# Patient Record
Sex: Female | Born: 1965
Health system: Southern US, Community
[De-identification: ages and names within clinical notes are randomized; demographics above are authoritative.]

## PROBLEM LIST (undated history)

## (undated) DIAGNOSIS — A159 Respiratory tuberculosis unspecified: Secondary | ICD-10-CM

## (undated) DIAGNOSIS — D649 Anemia, unspecified: Secondary | ICD-10-CM

## (undated) HISTORY — PX: BREAST EXCISIONAL BIOPSY: SUR124

## (undated) HISTORY — PX: DILATION AND CURETTAGE OF UTERUS: SHX78

## (undated) HISTORY — PX: BREAST SURGERY: SHX581

## (undated) HISTORY — PX: OTHER SURGICAL HISTORY: SHX169

## (undated) HISTORY — PX: WISDOM TOOTH EXTRACTION: SHX21

---

## 1997-09-22 DIAGNOSIS — A159 Respiratory tuberculosis unspecified: Secondary | ICD-10-CM

## 1997-09-22 HISTORY — DX: Respiratory tuberculosis unspecified: A15.9

## 1998-03-19 ENCOUNTER — Other Ambulatory Visit: Admission: RE | Admit: 1998-03-19 | Discharge: 1998-03-19 | Payer: Self-pay | Admitting: Obstetrics and Gynecology

## 1998-04-09 ENCOUNTER — Ambulatory Visit (HOSPITAL_COMMUNITY): Admission: RE | Admit: 1998-04-09 | Discharge: 1998-04-09 | Payer: Self-pay | Admitting: Obstetrics and Gynecology

## 1998-05-03 ENCOUNTER — Other Ambulatory Visit: Admission: RE | Admit: 1998-05-03 | Discharge: 1998-05-03 | Payer: Self-pay | Admitting: General Surgery

## 2000-10-05 ENCOUNTER — Other Ambulatory Visit: Admission: RE | Admit: 2000-10-05 | Discharge: 2000-10-05 | Payer: Self-pay | Admitting: Obstetrics and Gynecology

## 2001-08-24 ENCOUNTER — Encounter: Admission: RE | Admit: 2001-08-24 | Discharge: 2001-09-23 | Payer: Self-pay | Admitting: Obstetrics and Gynecology

## 2006-02-24 ENCOUNTER — Ambulatory Visit (HOSPITAL_COMMUNITY): Admission: AD | Admit: 2006-02-24 | Discharge: 2006-02-24 | Payer: Self-pay | Admitting: Obstetrics and Gynecology

## 2006-02-24 ENCOUNTER — Encounter (INDEPENDENT_AMBULATORY_CARE_PROVIDER_SITE_OTHER): Payer: Self-pay | Admitting: *Deleted

## 2006-02-25 ENCOUNTER — Inpatient Hospital Stay (HOSPITAL_COMMUNITY): Admission: AD | Admit: 2006-02-25 | Discharge: 2006-02-25 | Payer: Self-pay | Admitting: Obstetrics and Gynecology

## 2006-07-27 ENCOUNTER — Other Ambulatory Visit: Admission: RE | Admit: 2006-07-27 | Discharge: 2006-07-27 | Payer: Self-pay | Admitting: Obstetrics and Gynecology

## 2006-09-30 ENCOUNTER — Ambulatory Visit (HOSPITAL_COMMUNITY): Admission: RE | Admit: 2006-09-30 | Discharge: 2006-09-30 | Payer: Self-pay | Admitting: Obstetrics and Gynecology

## 2007-08-05 ENCOUNTER — Other Ambulatory Visit: Admission: RE | Admit: 2007-08-05 | Discharge: 2007-08-05 | Payer: Self-pay | Admitting: Obstetrics & Gynecology

## 2007-10-19 ENCOUNTER — Ambulatory Visit (HOSPITAL_COMMUNITY): Admission: RE | Admit: 2007-10-19 | Discharge: 2007-10-19 | Payer: Self-pay | Admitting: Obstetrics and Gynecology

## 2008-08-15 ENCOUNTER — Other Ambulatory Visit: Admission: RE | Admit: 2008-08-15 | Discharge: 2008-08-15 | Payer: Self-pay | Admitting: Obstetrics and Gynecology

## 2008-11-30 ENCOUNTER — Ambulatory Visit (HOSPITAL_COMMUNITY): Admission: RE | Admit: 2008-11-30 | Discharge: 2008-11-30 | Payer: Self-pay | Admitting: Obstetrics and Gynecology

## 2009-12-04 ENCOUNTER — Ambulatory Visit (HOSPITAL_COMMUNITY): Admission: RE | Admit: 2009-12-04 | Discharge: 2009-12-04 | Payer: Self-pay | Admitting: Obstetrics and Gynecology

## 2010-12-05 ENCOUNTER — Other Ambulatory Visit: Payer: Self-pay | Admitting: Obstetrics and Gynecology

## 2010-12-05 DIAGNOSIS — Z1231 Encounter for screening mammogram for malignant neoplasm of breast: Secondary | ICD-10-CM

## 2010-12-17 ENCOUNTER — Ambulatory Visit (HOSPITAL_COMMUNITY)
Admission: RE | Admit: 2010-12-17 | Discharge: 2010-12-17 | Disposition: A | Discharge status: Home / Self Care | Payer: BC Managed Care – PPO | Source: Ambulatory Visit | Attending: Obstetrics and Gynecology | Admitting: Obstetrics and Gynecology

## 2010-12-17 DIAGNOSIS — Z1231 Encounter for screening mammogram for malignant neoplasm of breast: Secondary | ICD-10-CM | POA: Insufficient documentation

## 2011-02-07 NOTE — Consult Note (Signed)
NAMESHAHARA, HARTSFIELD NO.:  [id number]   MEDICAL RECORD NO.:  [id number]          PATIENT TYPE:  MAT   LOCATION:  MATC                          FACILITY:  [facility]   PHYSICIAN:  Janine Limbo, M.D.DATE OF BIRTH:  11-Nov-1965   DATE OF CONSULTATION:  02/25/2006  DATE OF DISCHARGE:                                   CONSULTATION   REFERRING PHYSICIAN:  Naima A. Normand Sloop, M.D.   HISTORY OF PRESENT ILLNESS:  Ms. Scruton is a 45 year old female, para 3-0-  1-3, who presents with complaints of a fever at home of 102.  The patient  has been followed at the [facility]  of [facility].  The patient had a dilatation and  curettage on February 24, 2006 because of a first trimester miscarriage.  She  experienced very heavy vaginal bleeding prior to her procedure.  Her  procedure went well and there were no complications.  The patient was  discharged to home on Methergine, iron, and vitamins.  The patient reported  today, however, that she was having a fever even after taking Tylenol.   DRUG ALLERGIES:  No known drug allergies.   OBSTETRICAL HISTORY:  The patient has had 3 term vaginal deliveries.   SOCIAL HISTORY:  The patient denies cigarette use, alcohol use, and  recreational drug use.   REVIEW OF SYSTEMS:  Noncontributory.   FAMILY HISTORY:  Noncontributory.   PHYSICAL EXAM:  VITAL SIGNS:  Blood pressure 87/50 lying, 90/55 sitting,  89/58 standing.  Pulse 67 lying, 84 sitting, 112 standing.  Temperature is  98.1.  O2 SAT is 99% on room air.  HEENT:  Within normal limits.  CHEST:  Clear.  HEART:  Regular rate and rhythm.  ABDOMEN:  Nontender.  PELVIC:  External genitalia is normal.  Vagina is normal and nontender.  Cervix is nontender to motion.  Uterus is upper of limits normal size,  moderately firm, and nontender.  Adnexa -- no masses.   LABORATORY VALUES:  Blood type is O positive.  Chemistries are  within normal  limits, except for glucose of 103, total protein of 5.4, albumin of 2.9, and  an alkaline phosphatase of 30.  White blood cell count is 5200.  Hemoglobin  is 6.8.  Hematocrit is 20.6%.  Platelet count is 190,000.  Hemoglobin on  February 24, 2006 was 10.6.   ASSESSMENT:  1.  Fever at home, but afebrile here in the hospital.  2.  Significant anemia.  3.  Status post dilatation and curettage for a first trimester miscarriage.  4.  Orthostatic pulse changes but no orthostatic blood pressure changes.   PLAN:  A long discussion was held with the patient and her husband.  Options  were reviewed for transfusion.  The risk and benefit of transfusion were  outlined.  We also offered the patient to take iron at home and try to  increase her hemoglobin on her own.  She chooses this option.  She will take  iron 325 mg three times each day.  She will also increase her vitamin C  intake.  The patient will call for any orthostatic symptoms at home.  She  will call for a temperature greater than 100.4.  She will call for questions  or concerns.  She will otherwise plan to return to the office in 2 weeks for  followup examination.  She will continue to take ibuprofen for pain and  Methergine to help her uterus contract.      Janine Limbo, M.D.  Electronically Signed     AVS/MEDQ  D:  02/25/2006  T:  02/26/2006  Job:  [id number]   cc:   Naima A. Normand Sloop, M.D.  Fax: [phone]

## 2011-02-07 NOTE — Op Note (Signed)
[name], Cynthia Davis          ACCOUNT NO.:  [id number]   MEDICAL RECORD NO.:  [id number]          PATIENT TYPE:  AMB   LOCATION:  SDC                           [facility]ILITY:  [facility]   PHYSICIAN:  Naima A. Dillard, M.D. DATE OF BIRTH:  1966-04-06   DATE OF PROCEDURE:  02/24/2006  DATE OF DISCHARGE:                                 OPERATIVE REPORT   PREOPERATIVE DIAGNOSES:  1.  Incomplete abortion at eight weeks with,  2.  Orthostatic hypotension.   POSTOPERATIVE DIAGNOSES:  1.  Incomplete abortion at eight weeks with,  2.  Orthostatic hypotension.   OPERATION PERFORMED:  Dilation and evacuation.   SURGEON:  Naima A. Dillard, M.D.   ASSISTANT:  None.   ANESTHESIA:  MAC and local anesthesia.   SPECIMENS:  The specimens consists of products of conception, which were  sent to pathology.   ESTIMATED BLOOD LOSS:  The estimated blood loss was minimal.   COMPLICATIONS:  The complications are none.   CONDITION:  The patient went to the PACU in stable condition.   CONSENT:  Before the surgery the patient understood the risks to be, but not  limited to bleeding, infection perforation of the uterus, Asherman syndrome  and the patient still consented to the procedure.  She was orthostatic and  was told that we would recheck orthostatic values after the procedure; and,  if low, would offer her a blood transfusion.   DESCRIPTION OF THE OPERATION:  The patient was brought to the operating room  and placed in the dorsal lithotomy position.  She was prepped and draped in  the normal sterile fashion.  A catheter was used to drain the bladder.  Before the surgery was done the patient was examined and was noted to be 2  cm dilated and about eight weeks size.  She had an anteverted uterus with no  adnexal masses.   A bivalve speculum was placed into the vagina.  The anterior lip of the  cervix was grasped with a single-toothed tenaculum.  A total of 80 mL of 1%  lidocaine and 2 mL of bicarb  were used for a cervical block.  The cervix was  already dilated.  There were multiple products of conception seen at the os,  which were teased out with ring forceps.  Then a sharp curettage was done of  the cavity and more products of conception were removed from the uterus.  A  size 8 suction curettage was performed of the uterus and texture was noted  around all areas.  The tenaculum was removed and there was some bleeding  from the tenaculum site, which was relieved with pressure and silver  nitrate.   All instruments were removed from the vagina.   COUNTS:  Sponge, lap and needle counts were correct times two.   DISPOSITION:  The patient went to the recovery room/PACU  in stable  condition as noted above.      Naima A. Normand Sloop, M.D.  Electronically Signed     NAD/MEDQ  D:  02/24/2006  T:  02/25/2006  Job:  [id number]

## 2011-02-07 NOTE — H&P (Signed)
Cynthia Davis, Cynthia Davis          ACCOUNT NO.:  [id number]   MEDICAL RECORD NO.:  [id number]           PATIENT TYPE:   LOCATION:                                 FACILITY:   PHYSICIAN:  Naima A. Dillard, M.D. DATE OF BIRTH:  Nov 21, 1965   DATE OF ADMISSION:  DATE OF DISCHARGE:                                HISTORY & PHYSICAL   Cynthia Davis is a 45 year old gravida 4, para 3, 0-0-3, who presents today  at 10 weeks and 5 days by last menstrual period but 8 weeks and 2 days by  ultrasound, showing an intrauterine fetal demise.  The patient's history has  been remarkable for  advanced maternal age, transfer from [facility],  first trimester bleeding.  The patient was seen at [facility]  on Jan 21, 2006 for a consult about the practice.  She was interested in  transferring from [facility] in [place].  She was then seen on Jan 26, 2006 for an ultrasound due to dating.  This showed an intrauterine  pregnancy at 6 weeks, 5 days with an [facility] of September 16, 2006.  Fetal heart  rate was in the 140s.  The patient was then scheduled for a new OB workup in  one week; however, she presented today with onset of a small amount of  bright red bleeding.  She was having no pain at the time.  The patient was  brought to the office, where an ultrasound was done, and an 8 week fetal  demise was noted.  Ovaries were normal.  There was a small amount of pink  mucus noted at the os.  Cervix was closed.   Options were reviewed with the patient for observation, Cytotec induction of  miscarriage or dilatation and evacuation.  The patient was allowed to  consider the risks and benefits of each, and she elected to proceed with  dilatation and evacuation.  This has therefore been scheduled today with Dr.  Normand Sloop.   PRENATAL LABS:  Blood type is O+, per [facility] record.   OBSTETRICAL HISTORY:  The patient has a history of three previous full-term  deliveries, all of which occurred  at [facility].  She had  some borderline anemia with her pregnancies.   PAST MEDICAL HISTORY:  Remarkable for a breast lump that was diagnosed in  1999.  She was referred to Dr. Lurene Shadow for evaluation of a breast lump.  This  was evaluated at [facility].  This is noted to have a palpable mass  in the right breast.  The patient deferred any other management.  Dr.  Stefano Gaul was consulted, and his recommendation was that those areas of  breast lesions be removed; however, the patient in her previous evaluation  was told they did not need to be removed, and she elected to proceed with  this situation.   FAMILY HISTORY:  Noncontributory.   SOCIAL HISTORY:  Patient is married.  She is a Futures trader.  She does not  smoke, drink, or use drugs.   PHYSICAL EXAMINATION:  VITAL SIGNS:  Stable.  Patient  is afebrile.  HEENT:  Within normal limits.  LUNGS:  Bilateral breath sounds are clear.  HEART:  Regular rate and rhythm without murmur.  BREASTS:  Soft and nontender.  ABDOMEN:  Soft and nontender.  PELVIC:  Cervix is noted to be closed.  There is a small amount of pink  mucus at the cervical os.  GC and Chlamydia cultures were done.  Uterus is  nontender.  It is slightly enlarged at approximately eight weeks size.  EXTREMITIES:  Deep tendon reflexes are 2+ without clonus.  There is trace  edema noted.   IMPRESSION:  1.  Intrauterine fetal demise at 8 weeks and 2 days by size.  2.  O+ blood type.   PLAN:  1.  Patient is to be admitted for outpatient D&E on February 24, 2006 for a      consult with Dr. Normand Sloop as attending physician.  2.  Routine physician preoperative orders.  3.  Support is offered to the patient for her loss.      Renaldo Reel Emilee Hero, C.N.M.      Naima A. Normand Sloop, M.D.  Electronically Signed    VLL/MEDQ  D:  02/23/2006  T:  02/23/2006  Job:  [id number]

## 2011-12-11 ENCOUNTER — Encounter (HOSPITAL_COMMUNITY): Payer: Self-pay | Admitting: *Deleted

## 2011-12-16 ENCOUNTER — Other Ambulatory Visit: Payer: Self-pay | Admitting: Obstetrics and Gynecology

## 2011-12-16 DIAGNOSIS — Z1231 Encounter for screening mammogram for malignant neoplasm of breast: Secondary | ICD-10-CM

## 2012-01-05 ENCOUNTER — Encounter (HOSPITAL_COMMUNITY): Payer: Self-pay

## 2012-01-14 ENCOUNTER — Ambulatory Visit (HOSPITAL_COMMUNITY)
Admission: RE | Admit: 2012-01-14 | Discharge: 2012-01-14 | Disposition: A | Discharge status: Home / Self Care | Payer: BC Managed Care – PPO | Source: Ambulatory Visit | Attending: Obstetrics and Gynecology | Admitting: Obstetrics and Gynecology

## 2012-01-14 DIAGNOSIS — Z1231 Encounter for screening mammogram for malignant neoplasm of breast: Secondary | ICD-10-CM | POA: Insufficient documentation

## 2012-01-16 NOTE — H&P (Signed)
Cynthia Davis is an 46 y.o. female.   Chief Complaint: abnormal bleeding HPI: G6P3 with spotting between menses.  PUS showed uterus 9 x 5 x 6 cm with a 2 cm endometrial polyp.  Adnexa normal. Condoms for BC.    Past Medical History  Diagnosis Date  . Tuberculosis     Test pos for TB,  neg chest x-ray per patient  . Anemia     history    Past Surgical History  Procedure Date  . Dilation and curettage of uterus   . Wisdom tooth extraction   . Tongue clipped     at age 46 yrs old    History reviewed. No pertinent family history. Social History:  reports that she has never smoked. She has never used smokeless tobacco. She reports that she drinks alcohol. She reports that she does not use illicit drugs.  Allergies: No Known Allergies  No prescriptions prior to admission    No results found for this or any previous visit (from the past 48 hour(s)). No results found.  Review of Systems  All other systems reviewed and are negative.    Height 5' 6.5" (1.689 m), weight 68.04 kg (150 lb), last menstrual period 11/19/2011. Physical Exam  Constitutional: She is oriented to person, place, and time. She appears well-developed and well-nourished.  HENT:  Head: Normocephalic and atraumatic.  Eyes: Conjunctivae are normal.  Neck: Normal range of motion. Neck supple.  Cardiovascular: Normal rate and regular rhythm.   Respiratory: Effort normal and breath sounds normal.  GI: Soft. Bowel sounds are normal.  Genitourinary: Vagina normal.       Uterus upper limits nl size, NT Adnexa nl.  Musculoskeletal: Normal range of motion.  Neurological: She is alert and oriented to person, place, and time.  Skin: Skin is warm and dry.  Psychiatric: She has a normal mood and affect.     Assessment/Plan Endometrial polyp;  Plan hysteroscopic resection with D & C.  Julez Huseby P 01/16/2012, 3:04 PM

## 2012-01-19 ENCOUNTER — Encounter (HOSPITAL_COMMUNITY): Payer: Self-pay | Admitting: *Deleted

## 2012-01-19 ENCOUNTER — Ambulatory Visit (HOSPITAL_COMMUNITY)
Admission: RE | Admit: 2012-01-19 | Discharge: 2012-01-19 | Disposition: A | Discharge status: Home / Self Care | Payer: BC Managed Care – PPO | Source: Ambulatory Visit | Attending: Obstetrics and Gynecology | Admitting: Obstetrics and Gynecology

## 2012-01-19 ENCOUNTER — Encounter (HOSPITAL_COMMUNITY): Payer: Self-pay | Admitting: Anesthesiology

## 2012-01-19 ENCOUNTER — Encounter (HOSPITAL_COMMUNITY)
Admission: RE | Disposition: A | Discharge status: Home / Self Care | Payer: Self-pay | Source: Ambulatory Visit | Attending: Obstetrics and Gynecology

## 2012-01-19 ENCOUNTER — Ambulatory Visit (HOSPITAL_COMMUNITY): Payer: BC Managed Care – PPO | Admitting: Anesthesiology

## 2012-01-19 DIAGNOSIS — N840 Polyp of corpus uteri: Secondary | ICD-10-CM | POA: Insufficient documentation

## 2012-01-19 DIAGNOSIS — N938 Other specified abnormal uterine and vaginal bleeding: Secondary | ICD-10-CM | POA: Insufficient documentation

## 2012-01-19 DIAGNOSIS — N949 Unspecified condition associated with female genital organs and menstrual cycle: Secondary | ICD-10-CM | POA: Insufficient documentation

## 2012-01-19 HISTORY — PX: OTHER SURGICAL HISTORY: SHX169

## 2012-01-19 HISTORY — DX: Anemia, unspecified: D64.9

## 2012-01-19 HISTORY — DX: Respiratory tuberculosis unspecified: A15.9

## 2012-01-19 LAB — CBC: Platelets: 213 10*3/uL (ref 150–400)

## 2012-01-19 SURGERY — DILATATION & CURETTAGE/HYSTEROSCOPY WITH RESECTOCOPE
Anesthesia: General | Site: Vagina | Wound class: Clean Contaminated

## 2012-01-19 MED ORDER — ONDANSETRON HCL 4 MG/2ML IJ SOLN
INTRAMUSCULAR | Status: DC | PRN
  Administered 2012-01-19: 4 mg via INTRAVENOUS

## 2012-01-19 MED ORDER — SODIUM CHLORIDE 0.9 % IJ SOLN: 3.0000 mL | INTRAMUSCULAR | Status: DC | PRN

## 2012-01-19 MED ORDER — LIDOCAINE HCL (CARDIAC) 20 MG/ML IV SOLN
INTRAVENOUS | Status: DC | PRN
  Administered 2012-01-19: 100 mg via INTRAVENOUS

## 2012-01-19 MED ORDER — ACETAMINOPHEN 325 MG PO TABS: 650.0000 mg | ORAL_TABLET | ORAL | Status: DC | PRN

## 2012-01-19 MED ORDER — LACTATED RINGERS IV SOLN
INTRAVENOUS | Status: DC
  Administered 2012-01-19: 11:00:00 via INTRAVENOUS
  Administered 2012-01-19: 125 mL/h via INTRAVENOUS

## 2012-01-19 MED ORDER — LIDOCAINE HCL 1 % IJ SOLN
INTRAMUSCULAR | Status: DC | PRN
  Administered 2012-01-19: 20 mL

## 2012-01-19 MED ORDER — OXYCODONE HCL 5 MG PO TABS: 5.0000 mg | ORAL_TABLET | ORAL | Status: DC | PRN

## 2012-01-19 MED ORDER — ONDANSETRON HCL 4 MG/2ML IJ SOLN: 4.0000 mg | Freq: Four times a day (QID) | INTRAMUSCULAR | Status: DC | PRN

## 2012-01-19 MED ORDER — ACETAMINOPHEN 650 MG RE SUPP
650.0000 mg | RECTAL | Status: DC | PRN
  Filled 2012-01-19: qty 1

## 2012-01-19 MED ORDER — FENTANYL CITRATE 0.05 MG/ML IJ SOLN
INTRAMUSCULAR | Status: AC
  Filled 2012-01-19: qty 2

## 2012-01-19 MED ORDER — FENTANYL CITRATE 0.05 MG/ML IJ SOLN
INTRAMUSCULAR | Status: DC | PRN
  Administered 2012-01-19: 100 ug via INTRAVENOUS

## 2012-01-19 MED ORDER — PROPOFOL 10 MG/ML IV EMUL
INTRAVENOUS | Status: DC | PRN
  Administered 2012-01-19: 150 mg via INTRAVENOUS
  Administered 2012-01-19: 50 mg via INTRAVENOUS

## 2012-01-19 MED ORDER — KETOROLAC TROMETHAMINE 30 MG/ML IJ SOLN
INTRAMUSCULAR | Status: DC | PRN
  Administered 2012-01-19: 30 mg via INTRAVENOUS

## 2012-01-19 MED ORDER — GLYCINE 1.5 % IR SOLN
Status: DC | PRN
  Administered 2012-01-19: 3000 mL

## 2012-01-19 MED ORDER — DEXAMETHASONE SODIUM PHOSPHATE 4 MG/ML IJ SOLN
INTRAMUSCULAR | Status: DC | PRN
  Administered 2012-01-19: 10 mg via INTRAVENOUS

## 2012-01-19 MED ORDER — SODIUM CHLORIDE 0.9 % IJ SOLN: 3.0000 mL | Freq: Two times a day (BID) | INTRAMUSCULAR | Status: DC

## 2012-01-19 MED ORDER — MIDAZOLAM HCL 5 MG/5ML IJ SOLN
INTRAMUSCULAR | Status: DC | PRN
  Administered 2012-01-19: 2 mg via INTRAVENOUS

## 2012-01-19 MED ORDER — FENTANYL CITRATE 0.05 MG/ML IJ SOLN: 25.0000 ug | INTRAMUSCULAR | Status: DC | PRN

## 2012-01-19 MED ORDER — DEXTROSE IN LACTATED RINGERS 5 % IV SOLN: INTRAVENOUS | Status: DC

## 2012-01-19 MED ORDER — MIDAZOLAM HCL 2 MG/2ML IJ SOLN
INTRAMUSCULAR | Status: AC
  Filled 2012-01-19: qty 2

## 2012-01-19 MED ORDER — MORPHINE SULFATE 4 MG/ML IJ SOLN: 1.0000 mg | INTRAMUSCULAR | Status: DC | PRN

## 2012-01-19 MED ORDER — SODIUM CHLORIDE 0.9 % IV SOLN: 250.0000 mL | INTRAVENOUS | Status: DC | PRN

## 2012-01-19 SURGICAL SUPPLY — 18 items
CANISTER SUCTION 2500CC (MISCELLANEOUS) ×2 IMPLANT
CATH ROBINSON RED A/P 16FR (CATHETERS) ×2 IMPLANT
CONTAINER PREFILL 10% NBF 60ML (FORM) ×4 IMPLANT
CORD ACTIVE DISPOSABLE (ELECTRODE) ×1
CORD ELECTRO ACTIVE DISP (ELECTRODE) ×1 IMPLANT
ELECT LOOP GYNE PRO 24FR (CUTTING LOOP) ×2
ELECT REM PT RETURN 9FT ADLT (ELECTROSURGICAL) ×2
ELECT VAPORTRODE GRVD BAR (ELECTRODE) IMPLANT
ELECTRODE LOOP GYNE PRO 24FR (CUTTING LOOP) IMPLANT
ELECTRODE REM PT RTRN 9FT ADLT (ELECTROSURGICAL) ×1 IMPLANT
GLOVE BIOGEL PI IND STRL 7.0 (GLOVE) ×2 IMPLANT
GLOVE BIOGEL PI INDICATOR 7.0 (GLOVE) ×2
GLOVE ECLIPSE 6.5 STRL STRAW (GLOVE) ×2 IMPLANT
GOWN PREVENTION PLUS LG XLONG (DISPOSABLE) ×4 IMPLANT
GOWN STRL REIN XL XLG (GOWN DISPOSABLE) ×2 IMPLANT
PACK HYSTEROSCOPY LF (CUSTOM PROCEDURE TRAY) ×2 IMPLANT
TOWEL OR 17X24 6PK STRL BLUE (TOWEL DISPOSABLE) ×4 IMPLANT
WATER STERILE IRR 1000ML POUR (IV SOLUTION) ×2 IMPLANT

## 2012-01-19 NOTE — Anesthesia Procedure Notes (Signed)
Procedure Name: LMA Insertion Date/Time: 01/19/2012 10:32 AM Performed by: Dmitri Pettigrew, Jannet Askew Pre-anesthesia Checklist: Patient identified, Patient being monitored, Emergency Drugs available and Timeout performed Patient Re-evaluated:Patient Re-evaluated prior to inductionOxygen Delivery Method: Circle system utilized Preoxygenation: Pre-oxygenation with 100% oxygen Intubation Type: IV induction Ventilation: Mask ventilation without difficulty LMA: LMA inserted LMA Size: 4.0 Tube type: Oral Number of attempts: 1 Placement Confirmation: positive ETCO2 and breath sounds checked- equal and bilateral Tube secured with: at lips. Dental Injury: Teeth and Oropharynx as per pre-operative assessment

## 2012-01-19 NOTE — Transfer of Care (Signed)
Immediate Anesthesia Transfer of Care Note  Patient: Cynthia Davis  Procedure(s) Performed: Procedure(s) (LRB): DILATATION & CURETTAGE/HYSTEROSCOPY WITH RESECTOCOPE (N/A)  Patient Location: PACU  Anesthesia Type: General  Level of Consciousness: awake and alert   Airway & Oxygen Therapy: Patient Spontanous Breathing and Patient connected to nasal cannula oxygen  Post-op Assessment: Report given to PACU RN and Post -op Vital signs reviewed and stable  Post vital signs: Reviewed and stable  Complications: No apparent anesthesia complications

## 2012-01-19 NOTE — Anesthesia Preprocedure Evaluation (Signed)

## 2012-01-19 NOTE — Op Note (Signed)
Preoperative diagnosis: abnormal uterine bleeding Postoperative diagnosis: Same path pending Procedure: Hysteroscopy with resection of endometrial polyp, D&C Surgeon: Dr. Aram Beecham Carmon Sahli Anesthesia Gen. by LMA and paracervical block Estimated blood loss minimal Glycine deficit: 85 cc Complications: None Procedure: The patient was taken to the operating room and after induction of adequate general anesthesia by LMA she was placed in the dorsolithotomy position and prepped and draped in usual fashion.  The bladder was drained with a red rubber catheter.  The cervix was grasped on its anterior lip with a single-tooth tenaculum.  The uterus sounded to 8-1/2 cm.  The cervix was dilated to #31 Shawnie Pons.  The operative hysteroscope was then introduced using glycine as a distention medium with the pressure pump set at 80 mm mercury.  The endometrial polyp that had been seen in the office on sonohysterogram could be visualized easily and was emanating from the anterior wall near fundus.  Single loop cautery is used to resect the polyp in segments.  After completion of the resection, the endometrial cavity appeared clean.  Photographic documentation was taken.  The hysteroscope was removed.  Gentle sharp curettage was then carried out, and the specimen sent with the polyp for pathologic examination.  The instruments removed from the vagina, and and the procedure was terminated.  The patient went to recovery room in satisfactory condition.  Sponge needle and instrument  counts were correct.

## 2012-01-19 NOTE — Anesthesia Postprocedure Evaluation (Signed)
  Anesthesia Post-op Note  Patient: Cynthia Davis  Procedure(s) Performed: Procedure(s) (LRB): DILATATION & CURETTAGE/HYSTEROSCOPY WITH RESECTOCOPE (N/A)  Patient is awake and responsive. Pain and nausea are reasonably well controlled. Vital signs are stable and clinically acceptable. Oxygen saturation is clinically acceptable. There are no apparent anesthetic complications at this time. Patient is ready for discharge.

## 2012-01-19 NOTE — Interval H&P Note (Signed)
History and Physical Interval Note:  01/19/2012 9:48 AM  Cynthia Davis  has presented today for surgery, with the diagnosis of polyp  The various methods of treatment have been discussed with the patient and family. After consideration of risks, benefits and other options for treatment, the patient has consented to  Procedure(s) (LRB): DILATATION & CURETTAGE/HYSTEROSCOPY WITH RESECTOCOPE (N/A) as a surgical intervention .  The patients' history has been reviewed, patient examined, no change in status, stable for surgery.  I have reviewed the patients' chart and labs.  Questions were answered to the patient's satisfaction.     Dhalia Zingaro P

## 2012-01-19 NOTE — Discharge Instructions (Signed)
Hysteroscopy  Hysteroscopy is a procedure used for looking inside the womb (uterus). It may be done for many different reasons.  AFTER THE PROCEDURE  If you had a general anesthetic, you may be groggy for a couple hours after the procedure.   If you had a local anesthetic, you will be advised to rest at the surgical center or caregiver's office until you are stable and feel ready to go home.   You may have some cramping for a couple days.   You may have bleeding, which varies from light spotting for a few days to menstrual-like bleeding for up to 3 to 7 days. This is normal.   Have someone take you home.    HOME CARE INSTRUCTIONS  Do not drive for 24 hours or as instructed.   Only take over-the-counter or prescription medicines for pain, discomfort, or fever as directed by your caregiver.   Do not take aspirin. It can cause or aggravate bleeding.   Do not drive or drink alcohol while taking pain medicine.   You may resume your usual diet.   Do not use tampons, douche, or have sexual intercourse for 2 weeks, or as advised by your caregiver.   Rest and sleep for the first 24 to 48 hours.   Take your temperature twice a day for 4 to 5 days. Write it down. Give these temperatures to your caregiver if they are abnormal (above 98.6 F or 37.0 C).   Take medicines your caregiver has ordered as directed.   Follow your caregiver's advice regarding diet, exercise, lifting, driving, and general activities.   Take showers instead of baths for 2 weeks, or as recommended by your caregiver.   If you develop constipation: Take a mild laxative with the advice of your caregiver.   Eat bran foods.   Drink enough water and fluids to keep your urine clear or pale yellow.   Try to have someone with you or available to you for the first 24 to 48 hours, especially if you had a general anesthetic.   Make sure you and your family understand everything about your operation and recovery.    Follow your caregiver's advice regarding follow-up appointments.   FINDING OUT THE RESULTS OF YOUR TEST Not all test results are available during your visit. If your test results are not back during the visit, make an appointment with your caregiver to find out the results. Do not assume everything is normal if you have not heard from your caregiver or the medical facility. It is important for you to follow up on all of your test results.  SEEK MEDICAL CARE IF:  You feel dizzy or lightheaded.   You feel sick to your stomach (nauseous).   You develop abnormal vaginal discharge.   You develop a rash.   You have an abnormal reaction or allergy to your medicine.   You need stronger pain medicine.    SEEK IMMEDIATE MEDICAL CARE IF:  Bleeding is heavier than a normal menstrual period   You have an oral temperature above 100.5 not controlled by medicine.   You have increasing cramps or pains not relieved with medicine.   You develop belly (abdominal) pain that does not seem to be related to the same area of earlier cramping and pain.   You pass out.   You develop shortness of breath.   MAKE SURE YOU:  Understand these instructions.   Will watch your condition.  Will get help right away if you  are not doing well or get worse.  Hysteroscopy  Hysteroscopy is a procedure used for looking inside the womb (uterus). It may be done for many different reasons.  AFTER THE PROCEDURE If you had a general anesthetic, you may be groggy for a couple hours after the procedure.  If you had a local anesthetic, you will be advised to rest at the surgical center or caregiver's office until you are stable and feel ready to go home.  You may have some cramping for a couple days.  You may have bleeding, which varies from light spotting for a few days to menstrual-like bleeding for up to 3 to 7 days. This is normal.  Have someone take you home.   HOME CARE INSTRUCTIONS Do not drive for 24  hours or as instructed.  Only take over-the-counter or prescription medicines for pain, discomfort, or fever as directed by your caregiver.  Do not take aspirin. It can cause or aggravate bleeding.  Do not drive or drink alcohol while taking pain medicine.  You may resume your usual diet.  Do not use tampons, douche, or have sexual intercourse for 2 weeks, or as advised by your caregiver.  Rest and sleep for the first 24 to 48 hours.  Take your temperature twice a day for 4 to 5 days. Write it down. Give these temperatures to your caregiver if they are abnormal (above 98.6 F or 37.0 C).  Take medicines your caregiver has ordered as directed.  Follow your caregiver's advice regarding diet, exercise, lifting, driving, and general activities.  Take showers instead of baths for 2 weeks, or as recommended by your caregiver.  If you develop constipation: Take a mild laxative with the advice of your caregiver.  Eat bran foods.  Drink enough water and fluids to keep your urine clear or pale yellow.  Try to have someone with you or available to you for the first 24 to 48 hours, especially if you had a general anesthetic.  Make sure you and your family understand everything about your operation and recovery.  Follow your caregiver's advice regarding follow-up appointments.   FINDING OUT THE RESULTS OF YOUR TEST Not all test results are available during your visit. If your test results are not back during the visit, make an appointment with your caregiver to find out the results. Do not assume everything is normal if you have not heard from your caregiver or the medical facility. It is important for you to follow up on all of your test results.  SEEK MEDICAL CARE IF: You feel dizzy or lightheaded.  You feel sick to your stomach (nauseous).  You develop abnormal vaginal discharge.  You develop a rash.  You have an abnormal reaction or allergy to your medicine.  You need stronger pain medicine.     SEEK IMMEDIATE MEDICAL CARE IF: Bleeding is heavier than a normal menstrual period  You have an oral temperature above 100.5 not controlled by medicine.  You have increasing cramps or pains not relieved with medicine.  You develop belly (abdominal) pain that does not seem to be related to the same area of earlier cramping and pain.  You pass out.  You develop shortness of breath.   MAKE SURE YOU: Understand these instructions.  Will watch your condition.   Will get help right away if you are not doing well or get worse.

## 2012-12-07 ENCOUNTER — Ambulatory Visit: Payer: Self-pay | Admitting: Certified Nurse Midwife

## 2013-03-10 ENCOUNTER — Other Ambulatory Visit: Payer: Self-pay | Admitting: Obstetrics and Gynecology

## 2013-03-10 DIAGNOSIS — Z1231 Encounter for screening mammogram for malignant neoplasm of breast: Secondary | ICD-10-CM

## 2013-03-31 ENCOUNTER — Ambulatory Visit (HOSPITAL_COMMUNITY): Payer: BC Managed Care – PPO

## 2013-04-08 ENCOUNTER — Ambulatory Visit (HOSPITAL_COMMUNITY): Payer: BC Managed Care – PPO

## 2013-05-16 ENCOUNTER — Ambulatory Visit (HOSPITAL_COMMUNITY): Payer: BC Managed Care – PPO

## 2013-05-20 ENCOUNTER — Ambulatory Visit (HOSPITAL_COMMUNITY)
Admission: RE | Admit: 2013-05-20 | Discharge: 2013-05-20 | Disposition: A | Discharge status: Home / Self Care | Payer: BC Managed Care – PPO | Source: Ambulatory Visit | Attending: Obstetrics and Gynecology | Admitting: Obstetrics and Gynecology

## 2013-05-20 DIAGNOSIS — Z1231 Encounter for screening mammogram for malignant neoplasm of breast: Secondary | ICD-10-CM | POA: Insufficient documentation

## 2013-09-30 ENCOUNTER — Ambulatory Visit: Payer: BC Managed Care – PPO | Admitting: Family Medicine

## 2013-11-24 ENCOUNTER — Encounter: Payer: Self-pay | Admitting: Certified Nurse Midwife

## 2013-11-29 ENCOUNTER — Encounter: Payer: Self-pay | Admitting: Certified Nurse Midwife

## 2013-11-29 ENCOUNTER — Ambulatory Visit (INDEPENDENT_AMBULATORY_CARE_PROVIDER_SITE_OTHER): Payer: BC Managed Care – PPO | Admitting: Certified Nurse Midwife

## 2013-11-29 VITALS — BP 100/63 | HR 68 | Resp 16 | Ht 66.75 in | Wt 155.0 lb

## 2013-11-29 DIAGNOSIS — Z0000 Encounter for general adult medical examination without abnormal findings: Secondary | ICD-10-CM

## 2013-11-29 DIAGNOSIS — Z01419 Encounter for gynecological examination (general) (routine) without abnormal findings: Secondary | ICD-10-CM

## 2013-11-29 LAB — POCT URINALYSIS DIPSTICK
Bilirubin, UA: NEGATIVE
Blood, UA: NEGATIVE
GLUCOSE UA: NEGATIVE
KETONES UA: NEGATIVE
Leukocytes, UA: NEGATIVE
Nitrite, UA: NEGATIVE
PROTEIN UA: NEGATIVE
Urobilinogen, UA: NEGATIVE
pH, UA: 5

## 2013-11-29 NOTE — Progress Notes (Signed)
48 y.o. [id number] Married Caucasian Fe here for annual exam. Periods normal, no issues. Contraception working well. Good year! Cynthia Davis 12 now. Busy with family. No health issues. Sees Urgent Care if issues. Planning trip to [place].   Patient's last menstrual period was 11/17/2013.          Sexually active: yes  The current method of family planning is condoms most of the time.    Exercising: yes  walking Smoker:  no  Health Maintenance: Pap:  10-23-11 neg HPV HR neg MMG: 05-20-13 neg Colonoscopy:  none BMD:   none TDaP:  2008 Labs: Poct urine-neg, Hgb-12.7 Self breast exam: done monthly   reports that she has never smoked. She has never used smokeless tobacco. She reports that she drinks about 1.5 ounces of alcohol per week. She reports that she does not use illicit drugs.  Past Medical History  Diagnosis Date  . Tuberculosis 1999    Test pos for TB,  neg chest x-ray per patient  . Anemia     history    Past Surgical History  Procedure Laterality Date  . Dilation and curettage of uterus    . Wisdom tooth extraction    . Tongue clipped      at age 89 yrs old  . Svd      x 3  . Breast surgery      local - cyst removed from breast  . Hysteroscopic resection  01/19/12    of polyp, D&C- benign    Current Outpatient Prescriptions  Medication Sig Dispense Refill  . ibuprofen (ADVIL,MOTRIN) 200 MG tablet Take 400 mg by mouth daily as needed. headache       No current facility-administered medications for this visit.    Family History  Problem Relation Age of Onset  . Thyroid disease Mother     hypothyroid  . Cancer Mother     lung  . Hypertension Father   . Heart disease Father     pacemaker  . Dementia Maternal Grandmother   . Emphysema Maternal Grandfather   . Breast cancer Paternal Grandmother     ROS:  Pertinent items are noted in HPI.  Otherwise, a comprehensive ROS was negative.  Exam:   BP 100/63  Pulse 68  Resp 16  Ht 5' 6.75" (1.695 m)  Wt 155 lb  (70.308 kg)  BMI 24.47 kg/m2  LMP 11/17/2013 Height: 5' 6.75" (169.5 cm)  Ht Readings from Last 3 Encounters:  11/29/13 5' 6.75" (1.695 m)  12/11/11 5' 6.5" (1.689 m)  12/11/11 5' 6.5" (1.689 m)    General appearance: alert, cooperative and appears stated age Head: Normocephalic, without obvious abnormality, atraumatic Neck: no adenopathy, supple, symmetrical, trachea midline and thyroid normal to inspection and palpation and non-palpable Lungs: clear to auscultation bilaterally Breasts: normal appearance, no masses or tenderness, No nipple retraction or dimpling, No nipple discharge or bleeding, No axillary or supraclavicular adenopathy Heart: regular rate and rhythm Abdomen: soft, non-tender; no masses,  no organomegaly Extremities: extremities normal, atraumatic, no cyanosis or edema Skin: Skin color, texture, turgor normal. No rashes or lesions Lymph nodes: Cervical, supraclavicular, and axillary nodes normal. No abnormal inguinal nodes palpated Neurologic: Grossly normal   Pelvic: External genitalia:  no lesions              Urethra:  normal appearing urethra with no masses, tenderness or lesions              Bartholin's and Skene's: normal  Vagina: normal appearing vagina with normal color and discharge, no lesions              Cervix: normal, non tender              Pap taken: yes Bimanual Exam:  Uterus:  normal size, contour, position, consistency, mobility, non-tender and mid position and upper size of normal, no change previous exam              Adnexa: normal adnexa and no mass, fullness, tenderness               Rectovaginal: Confirms               Anus:  normal sphincter tone, no lesions  A:  Well Woman with normal exam  Contraception Condoms   P:   Reviewed health and wellness pertinent to exam  Plan fasting lab panel for next aex  Pap smear as per guidelines   Mammogram yearly pap smear taken with reflex to HPV  counseled on breast self exam,  mammography screening, adequate intake of calcium and vitamin D, diet and exercise, weight loss. Discussed watching portion size and increase vegetable intake.  return annually or prn  An After Visit Summary was printed and given to the patient.

## 2013-11-29 NOTE — Patient Instructions (Signed)

## 2013-11-30 LAB — HEMOGLOBIN, FINGERSTICK: HEMOGLOBIN, FINGERSTICK: 12.7 g/dL (ref 12.0–16.0)

## 2013-12-01 NOTE — Progress Notes (Signed)
Reviewed personally.  M. Suzanne Vashon Arch, MD.  

## 2013-12-02 LAB — IPS PAP TEST WITH REFLEX TO HPV

## 2013-12-05 ENCOUNTER — Telehealth: Payer: Self-pay

## 2013-12-05 NOTE — Telephone Encounter (Signed)
Message copied by Eliezer BottomJOHNSON, Dallen Bunte J on Mon Dec 05, 2013 10:22 AM ------      Message from: Verner CholLEONARD, DEBORAH S      Created: Mon Dec 05, 2013  7:50 AM       Notify patient that pap smear ASCUS and HPVHR negative. No further evaluation necessary due to negative HPVHR.  Most likely cause is changing cells when pap collected. Needs repeat pap smear in one year per guidelines. 02 ------

## 2013-12-05 NOTE — Telephone Encounter (Signed)
lmtcb

## 2013-12-05 NOTE — Telephone Encounter (Signed)
Patient notified of results. See lab 

## 2014-07-24 ENCOUNTER — Encounter: Payer: Self-pay | Admitting: Certified Nurse Midwife

## 2014-11-29 ENCOUNTER — Other Ambulatory Visit (HOSPITAL_COMMUNITY): Payer: Self-pay | Admitting: Obstetrics and Gynecology

## 2014-11-29 DIAGNOSIS — Z1231 Encounter for screening mammogram for malignant neoplasm of breast: Secondary | ICD-10-CM

## 2014-12-01 ENCOUNTER — Ambulatory Visit: Payer: Self-pay | Admitting: Certified Nurse Midwife

## 2014-12-08 ENCOUNTER — Ambulatory Visit (HOSPITAL_COMMUNITY)
Admission: RE | Admit: 2014-12-08 | Discharge: 2014-12-08 | Disposition: A | Discharge status: Home / Self Care | Payer: BLUE CROSS/BLUE SHIELD | Source: Ambulatory Visit | Attending: Obstetrics and Gynecology | Admitting: Obstetrics and Gynecology

## 2014-12-08 DIAGNOSIS — Z1231 Encounter for screening mammogram for malignant neoplasm of breast: Secondary | ICD-10-CM

## 2015-02-07 ENCOUNTER — Encounter: Payer: Self-pay | Admitting: Certified Nurse Midwife

## 2015-02-07 ENCOUNTER — Ambulatory Visit (INDEPENDENT_AMBULATORY_CARE_PROVIDER_SITE_OTHER): Payer: BLUE CROSS/BLUE SHIELD | Admitting: Certified Nurse Midwife

## 2015-02-07 VITALS — BP 100/60 | HR 68 | Resp 16 | Ht 66.75 in | Wt 157.0 lb

## 2015-02-07 DIAGNOSIS — Z01419 Encounter for gynecological examination (general) (routine) without abnormal findings: Secondary | ICD-10-CM

## 2015-02-07 DIAGNOSIS — Z124 Encounter for screening for malignant neoplasm of cervix: Secondary | ICD-10-CM

## 2015-02-07 DIAGNOSIS — Z0000 Encounter for general adult medical examination without abnormal findings: Secondary | ICD-10-CM | POA: Diagnosis not present

## 2015-02-07 LAB — COMPREHENSIVE METABOLIC PANEL
ALBUMIN: 4.2 g/dL (ref 3.5–5.2)
ALT: 10 U/L (ref 0–35)
AST: 19 U/L (ref 0–37)
Alkaline Phosphatase: 41 U/L (ref 39–117)
BUN: 12 mg/dL (ref 6–23)
CHLORIDE: 105 meq/L (ref 96–112)
CO2: 25 mEq/L (ref 19–32)
Calcium: 9.5 mg/dL (ref 8.4–10.5)
Creat: 0.69 mg/dL (ref 0.50–1.10)
Glucose, Bld: 86 mg/dL (ref 70–99)
POTASSIUM: 4.4 meq/L (ref 3.5–5.3)
Sodium: 136 mEq/L (ref 135–145)
TOTAL PROTEIN: 7.3 g/dL (ref 6.0–8.3)
Total Bilirubin: 0.7 mg/dL (ref 0.2–1.2)

## 2015-02-07 LAB — POCT URINALYSIS DIPSTICK
BILIRUBIN UA: NEGATIVE
Glucose, UA: NEGATIVE
KETONES UA: NEGATIVE
NITRITE UA: NEGATIVE
PH UA: 5
Protein, UA: NEGATIVE
Urobilinogen, UA: NEGATIVE

## 2015-02-07 LAB — CBC
HCT: 38 % (ref 36.0–46.0)
HEMOGLOBIN: 12.9 g/dL (ref 12.0–15.0)
MCH: 28.7 pg (ref 26.0–34.0)
MCHC: 33.9 g/dL (ref 30.0–36.0)
MCV: 84.6 fL (ref 78.0–100.0)
MPV: 10.2 fL (ref 8.6–12.4)
PLATELETS: 237 10*3/uL (ref 150–400)
RBC: 4.49 MIL/uL (ref 3.87–5.11)
RDW: 14 % (ref 11.5–15.5)
WBC: 5.1 10*3/uL (ref 4.0–10.5)

## 2015-02-07 LAB — LIPID PANEL
Cholesterol: 137 mg/dL (ref 0–200)
HDL: 43 mg/dL — AB (ref >=46)
LDL Cholesterol: 70 mg/dL (ref 0–99)
TRIGLYCERIDES: 120 mg/dL (ref <150)
Total CHOL/HDL Ratio: 3.2 Ratio
VLDL: 24 mg/dL (ref 0–40)

## 2015-02-07 LAB — TSH: TSH: 0.942 u[IU]/mL (ref 0.350–4.500)

## 2015-02-07 NOTE — Progress Notes (Signed)
49 y.o. [id number] Married  Caucasian Fe here for annual exam. Periods no problems. Occasional hot flashes, no issues. Sees Urgent care if needed. Occasional dizziness but no issues with, once in 6 months, only with reclining. Denies any problems today. Condoms working well. Planning outdoor trip to [place]     Patient's last menstrual period was 01/17/2015.          Sexually active: Yes.    The current method of family planning is condoms most of the time.    Exercising: Yes.    Walking  Smoker:  no  Health Maintenance: Pap: 11/29/13 ASC-US. HR HPV: Negative  MMG: 12/08/14 BIRADS1:Neg Density D needs 3 D mammogram yearly Self Breast Exam: yes, once a month Colonoscopy:  Never BMD:   Never TDaP:  2008 Labs: Will discuss with provider  UA: RBC=trace WBC=small    reports that she has never smoked. She has never used smokeless tobacco. She reports that she drinks about 1.5 oz of alcohol per week. She reports that she does not use illicit drugs.  Past Medical History  Diagnosis Date  . Tuberculosis 1999    Test pos for TB,  neg chest x-ray per patient  . Anemia     history    Past Surgical History  Procedure Laterality Date  . Dilation and curettage of uterus    . Wisdom tooth extraction    . Tongue clipped      at age 89 yrs old  . Svd      x 3  . Breast surgery      local - cyst removed from breast  . Hysteroscopic resection  01/19/12    of polyp, D&C- benign    Current Outpatient Prescriptions  Medication Sig Dispense Refill  . Calcium Carbonate-Vitamin D (CALCIUM + D PO) Take by mouth daily.    Marland Kitchen. ibuprofen (ADVIL,MOTRIN) 200 MG tablet Take 400 mg by mouth daily as needed. headache    . Omega-3 Fatty Acids (FISH OIL) 1000 MG CAPS Take by mouth daily.    . vitamin B-12 (CYANOCOBALAMIN) 100 MCG tablet Take 100 mcg by mouth daily.     No current facility-administered medications for this visit.    Family History  Problem Relation Age of Onset  . Thyroid disease Mother      hypothyroid  . Cancer Mother     lung  . Hypertension Father   . Heart disease Father     pacemaker  . Dementia Maternal Grandmother   . Emphysema Maternal Grandfather   . Breast cancer Paternal Grandmother     ROS:  Pertinent items are noted in HPI.  Otherwise, a comprehensive ROS was negative.  Exam:   BP 100/60 mmHg  Pulse 68  Resp 16  Ht 5' 6.75" (1.695 m)  Wt 157 lb (71.215 kg)  BMI 24.79 kg/m2  LMP 01/17/2015 Height: 5' 6.75" (169.5 cm) Ht Readings from Last 3 Encounters:  02/07/15 5' 6.75" (1.695 m)  11/29/13 5' 6.75" (1.695 m)  12/11/11 5' 6.5" (1.689 m)    General appearance: alert, cooperative and appears stated age Head: Normocephalic, without obvious abnormality, atraumatic Neck: no adenopathy, supple, symmetrical, trachea midline and thyroid normal to inspection and palpation Lungs: clear to auscultation bilaterally Breasts: normal appearance, no masses or tenderness, No nipple retraction or dimpling, No nipple discharge or bleeding, No axillary or supraclavicular adenopathy Heart: regular rate and rhythm Abdomen: soft, non-tender; no masses,  no organomegaly Extremities: extremities normal, atraumatic, no cyanosis or edema  Skin: Skin color, texture, turgor normal. No rashes or lesions Lymph nodes: Cervical, supraclavicular, and axillary nodes normal. No abnormal inguinal nodes palpated Neurologic: Grossly normal   Pelvic: External genitalia:  no lesions              Urethra:  normal appearing urethra with no masses, tenderness or lesions              Bartholin's and Skene's: normal                 Vagina: normal appearing vagina with normal color and discharge, no lesions              Cervix: normal,non tender,no lesions              Pap taken: Yes.   Bimanual Exam:  Uterus:  normal size, contour, position, consistency, mobility, non-tender              Adnexa: normal adnexa and no mass, fullness, tenderness               Rectovaginal: Confirms                Anus:  normal sphincter tone, no lesions    A:  Well Woman with normal exam  Contraception condoms  History of ASCUS pap with -HPVHR repeat pap today  Screening labs  R/O UTI  P:   Reviewed health and wellness pertinent to exam  Lab; Lipid panel, CMP, TSH,Vitamin D, CBC  Urine micro, aware of warning signs of UTI and will advise  Pap smear taken with HPVHR   counseled on breast self exam, mammography screening, adequate intake of calcium and vitamin D, diet and exercise  return annually or prn  An After Visit Summary was printed and given to the patient.

## 2015-02-07 NOTE — Patient Instructions (Signed)

## 2015-02-07 NOTE — Progress Notes (Signed)
Reviewed personally.  M. Suzanne Aleicia Kenagy, MD.  

## 2015-02-08 LAB — VITAMIN D 25 HYDROXY (VIT D DEFICIENCY, FRACTURES): VIT D 25 HYDROXY: 36 ng/mL (ref 30–100)

## 2015-02-08 LAB — URINALYSIS, MICROSCOPIC ONLY
Casts: NONE SEEN
Crystals: NONE SEEN

## 2015-02-09 LAB — IPS PAP TEST WITH HPV

## 2015-10-02 ENCOUNTER — Telehealth: Payer: Self-pay | Admitting: Certified Nurse Midwife

## 2015-10-02 ENCOUNTER — Encounter: Payer: Self-pay | Admitting: Certified Nurse Midwife

## 2015-10-02 NOTE — Telephone Encounter (Signed)
LMTCB ABOUT canceled appointment

## 2015-12-13 ENCOUNTER — Other Ambulatory Visit: Payer: Self-pay

## 2015-12-13 DIAGNOSIS — Z1231 Encounter for screening mammogram for malignant neoplasm of breast: Secondary | ICD-10-CM

## 2016-01-04 ENCOUNTER — Ambulatory Visit (INDEPENDENT_AMBULATORY_CARE_PROVIDER_SITE_OTHER): Payer: BLUE CROSS/BLUE SHIELD | Admitting: Physician Assistant

## 2016-01-04 VITALS — BP 104/68 | HR 64 | Temp 97.3°F | Resp 17 | Ht 66.5 in | Wt 156.0 lb

## 2016-01-04 DIAGNOSIS — M545 Low back pain, unspecified: Secondary | ICD-10-CM

## 2016-01-04 NOTE — Patient Instructions (Addendum)
Take 800 mg of ibuprofen every eight hours for 10 days.  Try to rest and ice your back as needed.  Please avoid over exerting your self.  We can consider prednisone and/or flexeril if you pain does not improve with time and Ibuprofen.  You can contact me at [phone] and we can step up therapy as needed.      IF you received an x-ray today, you will receive an invoice from [facility]. Please contact [facility] at [phone] with questions or concerns regarding your invoice.   IF you received labwork today, you will receive an invoice from [facility]. Please contact [facility] at [phone] with questions or concerns regarding your invoice.   Our billing staff will not be able to assist you with questions regarding bills from these companies.  You will be contacted with the lab results as soon as they are available. The fastest way to get your results is to activate your My Chart account. Instructions are located on the last page of this paperwork. If you have not heard from us regarding the results in 2 weeks, please contact this office.

## 2016-01-04 NOTE — Progress Notes (Signed)
01/04/2016 12:16 PM   DOB: 08/02/1966 / MRN: [id number]  SUBJECTIVE:  Cynthia Davis is a 50 y.o. female presenting for low back pain that started this morning while bike riding.  She has a history of back pain and was in [place] last week and did a lot of walking.  She reports that bending over makes her pain worse.  She has tried 800 mg of ibuprofen with some relief.  Denies dysuria, urgency, frequency, leg weakness, radiculopathy, and incontinence.    She has No Known Allergies.   She  has a past medical history of Tuberculosis (1999) and Anemia.    She  reports that she has never smoked. She has never used smokeless tobacco. She reports that she drinks about 1.5 oz of alcohol per week. She reports that she does not use illicit drugs. She  reports that she currently engages in sexual activity and has had female partners. She reports using the following method of birth control/protection: Condom. The patient  has past surgical history that includes Dilation and curettage of uterus; Wisdom tooth extraction; Tongue Clipped; svd; Breast surgery; and hysteroscopic resection (01/19/12).  Her family history includes Breast cancer in her paternal grandmother; Cancer in her mother; Dementia in her maternal grandmother; Emphysema in her maternal grandfather; Heart disease in her father; Hypertension in her father; Thyroid disease in her mother.  Review of Systems  Constitutional: Negative for fever and chills.  Respiratory: Negative for shortness of breath.   Gastrointestinal: Negative for nausea, vomiting and abdominal pain.  Genitourinary: Negative for dysuria, urgency and frequency.  Musculoskeletal: Positive for myalgias and back pain. Negative for falls.  Skin: Negative for rash.  Neurological: Negative for dizziness, tingling, focal weakness and headaches.  Psychiatric/Behavioral: The patient does not have insomnia.     Problem list and medications reviewed and updated by myself where  necessary, and exist elsewhere in the encounter.   OBJECTIVE:  BP 104/68 mmHg  Pulse 64  Temp(Src) 97.3 F (36.3 C) (Oral)  Resp 17  Ht 5' 6.5" (1.689 m)  Wt 156 lb (70.761 kg)  BMI 24.80 kg/m2  SpO2 98%  Physical Exam  Constitutional: She is oriented to person, place, and time. She appears well-nourished. No distress.  Eyes: EOM are normal. Pupils are equal, round, and reactive to light.  Cardiovascular: Normal rate.   Pulmonary/Chest: Effort normal.  Abdominal: She exhibits no distension.  Musculoskeletal:       Lumbar back: She exhibits decreased range of motion, tenderness, pain and spasm. She exhibits no bony tenderness, no swelling, no edema, no deformity and no laceration.  Neurological: She is alert and oriented to person, place, and time. She has normal strength. No cranial nerve deficit or sensory deficit. Coordination and gait normal.  Reflex Scores:      Patellar reflexes are 2+ on the right side and 2+ on the left side.      Achilles reflexes are 2+ on the right side and 2+ on the left side. Skin: Skin is dry. She is not diaphoretic.  Psychiatric: She has a normal mood and affect.  Vitals reviewed.   No results found for this or any previous visit (from the past 72 hour(s)).  No results found.  ASSESSMENT AND PLAN  Cynthia Davis was seen today for back pain.  Diagnoses and all orders for this visit:  Midline low back pain without sciatica: No alarm features.  Advised that she continue 800 mg Ibuprofen q8 for the next ten days.  Tylenol  [phone] q8 prn.  Advised we reserve prednisone and flexeril if this fails.  She is to call or RTC in 10 days if not improved.    The patient was advised to call or return to clinic if she does not see an improvement in symptoms or to seek the care of the closest emergency department if she worsens with the above plan.   Deliah Boston, MHS, PA-C [facility] 01/04/2016 12:16 PM

## 2016-01-07 ENCOUNTER — Ambulatory Visit: Payer: BLUE CROSS/BLUE SHIELD

## 2016-02-13 ENCOUNTER — Ambulatory Visit (INDEPENDENT_AMBULATORY_CARE_PROVIDER_SITE_OTHER): Payer: BLUE CROSS/BLUE SHIELD | Admitting: Certified Nurse Midwife

## 2016-02-13 ENCOUNTER — Other Ambulatory Visit: Payer: Self-pay | Admitting: Certified Nurse Midwife

## 2016-02-13 ENCOUNTER — Encounter: Payer: Self-pay | Admitting: Certified Nurse Midwife

## 2016-02-13 VITALS — Ht 66.5 in | Wt 158.0 lb

## 2016-02-13 DIAGNOSIS — Z0000 Encounter for general adult medical examination without abnormal findings: Secondary | ICD-10-CM

## 2016-02-13 DIAGNOSIS — Z01419 Encounter for gynecological examination (general) (routine) without abnormal findings: Secondary | ICD-10-CM | POA: Diagnosis not present

## 2016-02-13 LAB — TSH: TSH: 1.67 m[IU]/L

## 2016-02-13 LAB — POCT URINALYSIS DIPSTICK
Bilirubin, UA: NEGATIVE
Blood, UA: NEGATIVE
Glucose, UA: NEGATIVE
Ketones, UA: NEGATIVE
LEUKOCYTES UA: NEGATIVE
Nitrite, UA: NEGATIVE
PROTEIN UA: NEGATIVE
UROBILINOGEN UA: NEGATIVE
pH, UA: 5

## 2016-02-13 LAB — LIPID PANEL
CHOLESTEROL: 138 mg/dL (ref 125–200)
HDL: 54 mg/dL (ref >=46)
LDL CALC: 53 mg/dL (ref <130)
TRIGLYCERIDES: 156 mg/dL — AB (ref <150)
Total CHOL/HDL Ratio: 2.6 Ratio (ref <=5.0)
VLDL: 31 mg/dL — ABNORMAL HIGH (ref <30)

## 2016-02-13 LAB — CBC
HCT: 37.9 % (ref 35.0–45.0)
HEMOGLOBIN: 12.2 g/dL (ref 11.7–15.5)
MCH: 28.1 pg (ref 27.0–33.0)
MCHC: 32.2 g/dL (ref 32.0–36.0)
MCV: 87.3 fL (ref 80.0–100.0)
MPV: 10.2 fL (ref 7.5–12.5)
Platelets: 258 10*3/uL (ref 140–400)
RBC: 4.34 MIL/uL (ref 3.80–5.10)
RDW: 14 % (ref 11.0–15.0)
WBC: 5.5 10*3/uL (ref 3.8–10.8)

## 2016-02-13 NOTE — Patient Instructions (Signed)

## 2016-02-13 NOTE — Progress Notes (Signed)
50 y.o. [id number] Married  Caucasian Fe here for annual exam. Periods normal, no issues. Contraception working. Urgent care only if needed. No health issues today. Desires screening labs. Planning to bike [place] for 50th birthday[place]    Patient's last menstrual period was 01/28/2016.          Sexually active: Yes.    The current method of family planning is condoms & rhythm method.    Exercising: Yes.    walk & bike Smoker:  no  Health Maintenance: Pap: 02-07-15 neg HPV HR neg MMG: 12-08-14 category d density,birads 1:neg Colonoscopy: none BMD:   none TDaP:  2008  Shingles: no Pneumonia: no Hep C and HIV: HIV done with pregnancy Labs: poct urine-neg, hgb-11.9 Self breast exam: done occ   reports that she has never smoked. She has never used smokeless tobacco. She reports that she drinks about 1.2 oz of alcohol per week. She reports that she does not use illicit drugs.  Past Medical History  Diagnosis Date  . Tuberculosis 1999    Test pos for TB,  neg chest x-ray per patient  . Anemia     history    Past Surgical History  Procedure Laterality Date  . Dilation and curettage of uterus    . Wisdom tooth extraction    . Tongue clipped      at age 89 yrs old  . Svd      x 3  . Breast surgery      local - cyst removed from breast  . Hysteroscopic resection  01/19/12    of polyp, D&C- benign    Current Outpatient Prescriptions  Medication Sig Dispense Refill  . Calcium Carbonate-Vitamin D (CALCIUM + D PO) Take by mouth as needed.     Marland Kitchen. ibuprofen (ADVIL,MOTRIN) 200 MG tablet Take 400 mg by mouth daily as needed. headache     No current facility-administered medications for this visit.    Family History  Problem Relation Age of Onset  . Thyroid disease Mother     hypothyroid  . Cancer Mother     lung  . Hypertension Father   . Heart disease Father     pacemaker  . Dementia Maternal Grandmother   . Emphysema Maternal Grandfather   . Breast cancer Paternal Grandmother      ROS:  Pertinent items are noted in HPI.  Otherwise, a comprehensive ROS was negative.  Exam:   Ht 5' 6.5" (1.689 m)  Wt 158 lb (71.668 kg)  BMI 25.12 kg/m2  LMP 01/28/2016 Height: 5' 6.5" (168.9 cm) Ht Readings from Last 3 Encounters:  02/13/16 5' 6.5" (1.689 m)  01/04/16 5' 6.5" (1.689 m)  02/07/15 5' 6.75" (1.695 m)    General appearance: alert, cooperative and appears stated age Head: Normocephalic, without obvious abnormality, atraumatic Neck: no adenopathy, supple, symmetrical, trachea midline and thyroid normal to inspection and palpation Lungs: clear to auscultation bilaterally Breasts: normal appearance, no masses or tenderness, No nipple retraction or dimpling, No nipple discharge or bleeding, No axillary or supraclavicular adenopathy Heart: regular rate and rhythm Abdomen: soft, non-tender; no masses,  no organomegaly Extremities: extremities normal, atraumatic, no cyanosis or edema Skin: Skin color, texture, turgor normal. No rashes or lesions Lymph nodes: Cervical, supraclavicular, and axillary nodes normal. No abnormal inguinal nodes palpated Neurologic: Grossly normal   Pelvic: External genitalia:  no lesions              Urethra:  normal appearing urethra with no masses,  tenderness or lesions              Bartholin's and Skene's: normal                 Vagina: normal appearing vagina with normal color and discharge, no lesions              Cervix: multiparous appearance, no cervical motion tenderness and no lesions              Pap taken: No. Bimanual Exam:  Uterus:  normal size, contour, position, consistency, mobility, non-tender              Adnexa: normal adnexa and no mass, fullness, tenderness               Rectovaginal: Confirms               Anus:  normal sphincter tone, no lesions  Chaperone present: yes  A:  Well Woman with normal exam  Contraception condoms and NFP  Screening labs  P:   Reviewed health and wellness pertinent to  exam  Labs:CBC, Hep C, Lipid panel, TSH, Vitamin D  Pap smear as above not taken   counseled on breast self exam, mammography screening, adequate intake of calcium and vitamin D, diet and exercise. Risks and benefits of colonoscopy given. Declines scheduling this year.  return annually or prn  An After Visit Summary was printed and given to the patient.

## 2016-02-14 LAB — VITAMIN D 25 HYDROXY (VIT D DEFICIENCY, FRACTURES): Vit D, 25-Hydroxy: 32 ng/mL (ref 30–100)

## 2016-02-14 LAB — HEPATITIS C ANTIBODY: HCV AB: NEGATIVE

## 2016-02-14 NOTE — Addendum Note (Signed)
Addended by: Verner CholLEONARD, Keaghan Staton S on: 02/14/2016 08:59 AM   Modules accepted: Orders, SmartSet

## 2016-02-14 NOTE — Progress Notes (Signed)
Reviewed personally.  M. Suzanne Davelyn Gwinn, MD.  

## 2016-02-15 ENCOUNTER — Ambulatory Visit: Payer: BLUE CROSS/BLUE SHIELD | Admitting: Certified Nurse Midwife

## 2016-04-02 ENCOUNTER — Ambulatory Visit
Admission: RE | Admit: 2016-04-02 | Discharge: 2016-04-02 | Disposition: A | Discharge status: Home / Self Care | Payer: BLUE CROSS/BLUE SHIELD | Source: Ambulatory Visit

## 2016-04-02 DIAGNOSIS — Z1231 Encounter for screening mammogram for malignant neoplasm of breast: Secondary | ICD-10-CM

## 2016-04-07 ENCOUNTER — Other Ambulatory Visit: Payer: Self-pay | Admitting: Obstetrics and Gynecology

## 2016-04-07 DIAGNOSIS — R928 Other abnormal and inconclusive findings on diagnostic imaging of breast: Secondary | ICD-10-CM

## 2016-04-11 ENCOUNTER — Ambulatory Visit
Admission: RE | Admit: 2016-04-11 | Discharge: 2016-04-11 | Disposition: A | Discharge status: Home / Self Care | Payer: BLUE CROSS/BLUE SHIELD | Source: Ambulatory Visit | Attending: Obstetrics and Gynecology | Admitting: Obstetrics and Gynecology

## 2016-04-11 DIAGNOSIS — R928 Other abnormal and inconclusive findings on diagnostic imaging of breast: Secondary | ICD-10-CM

## 2016-04-11 DIAGNOSIS — N6002 Solitary cyst of left breast: Secondary | ICD-10-CM | POA: Diagnosis not present

## 2016-04-11 DIAGNOSIS — R922 Inconclusive mammogram: Secondary | ICD-10-CM | POA: Diagnosis not present

## 2016-08-05 DIAGNOSIS — F4323 Adjustment disorder with mixed anxiety and depressed mood: Secondary | ICD-10-CM | POA: Diagnosis not present

## 2016-09-03 DIAGNOSIS — F4323 Adjustment disorder with mixed anxiety and depressed mood: Secondary | ICD-10-CM | POA: Diagnosis not present

## 2017-02-13 ENCOUNTER — Encounter: Payer: Self-pay | Admitting: Certified Nurse Midwife

## 2017-02-13 ENCOUNTER — Ambulatory Visit (INDEPENDENT_AMBULATORY_CARE_PROVIDER_SITE_OTHER): Payer: BLUE CROSS/BLUE SHIELD | Admitting: Certified Nurse Midwife

## 2017-02-13 ENCOUNTER — Other Ambulatory Visit (HOSPITAL_COMMUNITY)
Admission: RE | Admit: 2017-02-13 | Discharge: 2017-02-13 | Disposition: A | Discharge status: Home / Self Care | Payer: BLUE CROSS/BLUE SHIELD | Source: Ambulatory Visit | Attending: Obstetrics & Gynecology | Admitting: Obstetrics & Gynecology

## 2017-02-13 VITALS — BP 102/62 | HR 60 | Resp 16 | Ht 66.5 in | Wt 159.0 lb

## 2017-02-13 DIAGNOSIS — R42 Dizziness and giddiness: Secondary | ICD-10-CM

## 2017-02-13 DIAGNOSIS — Z0000 Encounter for general adult medical examination without abnormal findings: Secondary | ICD-10-CM | POA: Diagnosis not present

## 2017-02-13 DIAGNOSIS — Z1211 Encounter for screening for malignant neoplasm of colon: Secondary | ICD-10-CM | POA: Diagnosis not present

## 2017-02-13 DIAGNOSIS — Z23 Encounter for immunization: Secondary | ICD-10-CM

## 2017-02-13 DIAGNOSIS — Z124 Encounter for screening for malignant neoplasm of cervix: Secondary | ICD-10-CM

## 2017-02-13 DIAGNOSIS — Z01419 Encounter for gynecological examination (general) (routine) without abnormal findings: Secondary | ICD-10-CM | POA: Insufficient documentation

## 2017-02-13 LAB — COMPREHENSIVE METABOLIC PANEL
ALT: 10 U/L (ref 6–29)
AST: 21 U/L (ref 10–35)
Albumin: 4.4 g/dL (ref 3.6–5.1)
Alkaline Phosphatase: 42 U/L (ref 33–130)
BILIRUBIN TOTAL: 0.7 mg/dL (ref 0.2–1.2)
BUN: 14 mg/dL (ref 7–25)
CO2: 27 mmol/L (ref 20–31)
Calcium: 9.9 mg/dL (ref 8.6–10.4)
Chloride: 103 mmol/L (ref 98–110)
Creat: 0.81 mg/dL (ref 0.50–1.05)
GLUCOSE: 91 mg/dL (ref 65–99)
POTASSIUM: 4.1 mmol/L (ref 3.5–5.3)
Sodium: 136 mmol/L (ref 135–146)
Total Protein: 7.5 g/dL (ref 6.1–8.1)

## 2017-02-13 LAB — THYROID PANEL WITH TSH
FREE THYROXINE INDEX: 2.2 (ref 1.4–3.8)
T3 Uptake: 30 % (ref 22–35)
T4, Total: 7.2 ug/dL (ref 4.5–12.0)
TSH: 1.52 m[IU]/L

## 2017-02-13 LAB — CBC
HCT: 41.8 % (ref 35.0–45.0)
HEMOGLOBIN: 13.8 g/dL (ref 11.7–15.5)
MCH: 28.6 pg (ref 27.0–33.0)
MCHC: 33 g/dL (ref 32.0–36.0)
MCV: 86.7 fL (ref 80.0–100.0)
MPV: 9.9 fL (ref 7.5–12.5)
Platelets: 252 10*3/uL (ref 140–400)
RBC: 4.82 MIL/uL (ref 3.80–5.10)
RDW: 13.9 % (ref 11.0–15.0)
WBC: 7.3 10*3/uL (ref 3.8–10.8)

## 2017-02-13 LAB — LIPID PANEL
CHOLESTEROL: 157 mg/dL (ref <200)
HDL: 51 mg/dL (ref >50)
LDL Cholesterol: 82 mg/dL (ref <100)
TRIGLYCERIDES: 122 mg/dL (ref <150)
Total CHOL/HDL Ratio: 3.1 Ratio (ref <5.0)
VLDL: 24 mg/dL (ref <30)

## 2017-02-13 NOTE — Addendum Note (Signed)
Addended by: Verner CholLEONARD, DEBORAH S on: 02/13/2017 12:26 PM   Modules accepted: Orders

## 2017-02-13 NOTE — Patient Instructions (Signed)

## 2017-02-13 NOTE — Progress Notes (Signed)
51 y.o. [id number] Married  Caucasian Fe here for annual exam. Periods normal, occasional hot flash, but no changes in regularity or amount. Worried about having dementia. Has had no memory changes or concerns for herself, but family history of dementia with MGM. She is busy with work and social functions with children, "normal life". Continues with dizziness that last for 2 -3 days and also occurs at hs. No nausea, but feels different until it stops. Previous occurrence last year was very sporadic, but has increased. No allergies and no ear issues or visual changes.. Has tried OTC medication occasional, but no routine. No history of migraine headache, or aura. Concerned this has continued. Screening labs today. No other health issues today. Planning some vacation she hopes!  Patient's last menstrual period was 01/28/2017 (exact date).          Sexually active: Yes.    The current method of family planning is condoms most of the time.    Exercising: Yes.    walk, pilates Smoker:  no  Health Maintenance: Pap:  02-07-15 neg HPV HR neg History of Abnormal Pap: no MMG:  7-21-17bilateral & left breast category c density, birads 2:neg Self Breast exams: occ Colonoscopy:  none BMD:   none TDaP:  12/08 Shingles: no Pneumonia: no Hep C and HIV: HIV neg with pregnancy, Hep c neg 2017 Labs: none   reports that she has never smoked. She has never used smokeless tobacco. She reports that she drinks about 0.6 - 1.2 oz of alcohol per week . She reports that she does not use drugs.  Past Medical History:  Diagnosis Date  . Anemia    history  . Tuberculosis 1999   Test pos for TB,  neg chest x-ray per patient    Past Surgical History:  Procedure Laterality Date  . BREAST SURGERY     local - cyst removed from breast  . DILATION AND CURETTAGE OF UTERUS    . hysteroscopic resection  01/19/12   of polyp, D&C- benign  . svd     x 3  . Tongue Clipped     at age 51 yrs old  . WISDOM TOOTH EXTRACTION       Current Outpatient Prescriptions  Medication Sig Dispense Refill  . Calcium Carbonate-Vitamin D (CALCIUM + D PO) Take by mouth as needed.     Marland Kitchen ibuprofen (ADVIL,MOTRIN) 200 MG tablet Take 400 mg by mouth daily as needed. headache     No current facility-administered medications for this visit.     Family History  Problem Relation Age of Onset  . Thyroid disease Mother        hypothyroid  . Cancer Mother        lung  . Hypertension Father   . Heart disease Father        pacemaker  . Dementia Maternal Grandmother   . Emphysema Maternal Grandfather   . Breast cancer Paternal Grandmother     ROS:  Pertinent items are noted in HPI.  Otherwise, a comprehensive ROS was negative.  Exam:   BP 102/62   Pulse 60   Resp 16   Ht 5' 6.5" (1.689 m)   Wt 159 lb (72.1 kg)   LMP 01/28/2017 (Exact Date)   BMI 25.28 kg/m  Height: 5' 6.5" (168.9 cm) Ht Readings from Last 3 Encounters:  02/13/17 5' 6.5" (1.689 m)  02/13/16 5' 6.5" (1.689 m)  01/04/16 5' 6.5" (1.689 m)    General appearance: alert, cooperative  and appears stated age Head: Normocephalic, without obvious abnormality, atraumatic Neck: no adenopathy, supple, symmetrical, trachea midline and thyroid normal to inspection and palpation and slight enlargement bilateral no nodules noted Lungs: clear to auscultation bilaterally Breasts: normal appearance, no masses or tenderness, No nipple retraction or dimpling, No nipple discharge or bleeding Heart: regular rate and rhythm Abdomen: soft, non-tender; no masses,  no organomegaly Extremities: extremities normal, atraumatic, no cyanosis or edema Skin: Skin color, texture, turgor normal. No rashes or lesions Lymph nodes: Cervical, supraclavicular, and axillary nodes normal. No abnormal inguinal nodes palpated Neurologic: Grossly normal   Pelvic: External genitalia:  no lesions              Urethra:  normal appearing urethra with no masses, tenderness or lesions               Bartholin's and Skene's: normal                 Vagina: normal appearing vagina with normal color and discharge, no lesions              Cervix: multiparous appearance, no cervical motion tenderness and no lesions              Pap taken: Yes.   Bimanual Exam:  Uterus:  normal size, contour, position, consistency, mobility, non-tender              Adnexa: normal adnexa and no mass, fullness, tenderness               Rectovaginal: Confirms               Anus:  normal sphincter tone, no lesions  Chaperone present: yes  A:  Well Woman with normal exam  Contraception condoms  Dizziness ? Vertigo with increase occurrence  Family history of dementia, patient has no symptoms  Colonoscopy due  Immunization update  Screening labs  P:   Reviewed health and wellness pertinent to exam  Discussed due continue occurrence feel she should be evaluated by neurology for continued dizziness occurrence. Patient relieved and agreeable. Patient will be called with information on referral.  Discussed dementia is a devastating occurrence, but being active, normal weight, staying socially connected, challenging your brain with puzzles ,quizzes can help. Discussed she has no symptoms and if occurs needs to advise and not hide them, so changes can be made. Discussed usually late age changes involved also. Questions addressed.  Discussed risks/benefits of colonoscopy, patient ready to schedule. Patient will be called with referral information to Dr.Mann.  Requests TDAP  Labs: CBC, CMP, Lipid panel, TSH with panel, Vitamin D  Pap smear: yes   counseled on breast self exam, mammography screening, adequate intake of calcium and vitamin D, diet and exercise  return annually or prn  An After Visit Summary was printed and given to the patient.

## 2017-02-14 LAB — VITAMIN D 25 HYDROXY (VIT D DEFICIENCY, FRACTURES): VIT D 25 HYDROXY: 37 ng/mL (ref 30–100)

## 2017-02-17 LAB — CYTOLOGY - PAP: DIAGNOSIS: NEGATIVE

## 2017-02-23 ENCOUNTER — Ambulatory Visit (INDEPENDENT_AMBULATORY_CARE_PROVIDER_SITE_OTHER): Payer: BLUE CROSS/BLUE SHIELD | Admitting: Physician Assistant

## 2017-02-23 ENCOUNTER — Encounter: Payer: Self-pay | Admitting: Physician Assistant

## 2017-02-23 VITALS — BP 103/63 | HR 63 | Temp 98.8°F | Resp 17 | Ht 68.0 in | Wt 167.0 lb

## 2017-02-23 DIAGNOSIS — R21 Rash and other nonspecific skin eruption: Secondary | ICD-10-CM

## 2017-02-23 DIAGNOSIS — W57XXXA Bitten or stung by nonvenomous insect and other nonvenomous arthropods, initial encounter: Secondary | ICD-10-CM | POA: Diagnosis not present

## 2017-02-23 NOTE — Progress Notes (Signed)
   Cynthia Davis  MRN: [id number] DOB: 10/24/1965  PCP: Patient, No Pcp Per  Chief Complaint  Patient presents with  . Insect Bite    Subjective:  Pt presents to clinic for rash on her left abd that does not itch or bother her.  She removed a tick about 2 weeks ago that was small but she is unsure what type it was.  She feels fine but she thought she might need to be tested due to the stories about long term tick problems that can occur.  She feels fine.  She gets a lot of tick bites but has never gotten a rash like this before  Review of Systems  Constitutional: Negative for chills and fever.  Musculoskeletal: Negative for myalgias and neck pain.  Skin: Positive for rash.  Neurological: Negative for headaches.    There are no active problems to display for this patient.   Current Outpatient Prescriptions on File Prior to Visit  Medication Sig Dispense Refill  . ibuprofen (ADVIL,MOTRIN) 200 MG tablet Take 400 mg by mouth daily as needed. headache    . Calcium Carbonate-Vitamin D (CALCIUM + D PO) Take by mouth as needed.     . Cyanocobalamin (B-12 PO) Take by mouth daily.     No current facility-administered medications on file prior to visit.     No Known Allergies  Pt patients past, family and social history were reviewed and updated.   Objective:  BP 103/63   Pulse 63   Temp 98.8 F (37.1 C) (Oral)   Resp 17   Ht 5\' 8"  (1.727 m)   Wt 167 lb (75.8 kg)   LMP 02/20/2017 (Approximate)   SpO2 98%   BMI 25.39 kg/m   Physical Exam  Skin: Rash (see photo) noted.         Assessment and Plan :  Tick bite, initial encounter - Plan: Lyme Ab/Western Blot Reflex, Rocky mtn spotted fvr ab, IgM-blood  Rash and nonspecific skin eruption - Plan: Lyme Ab/Western Blot Reflex, Rocky mtn spotted fvr ab, IgM-blood, Care order/instruction:   Pt current feels fine - check labs and treat if indicated  Benny LennertSarah Burdett Pinzon PA-C  [facility] 02/23/2017 4:54 PM

## 2017-02-23 NOTE — Patient Instructions (Addendum)
I will contact you with your lab results as soon as they are available.   If you have not heard from me in 2 weeks, please contact me.  The fastest way to get your results is to register for My Chart (see the instructions on the last page of this printout).   IF you received an x-ray today, you will receive an invoice from [facility]. Please contact [facility] at [phone] with questions or concerns regarding your invoice.   IF you received labwork today, you will receive an invoice from [facility]. Please contact [facility] at [phone] with questions or concerns regarding your invoice.   Our billing staff will not be able to assist you with questions regarding bills from these companies.  You will be contacted with the lab results as soon as they are available. The fastest way to get your results is to activate your My Chart account. Instructions are located on the last page of this paperwork. If you have not heard from us regarding the results in 2 weeks, please contact this office.      

## 2017-02-25 LAB — LYME AB/WESTERN BLOT REFLEX

## 2017-02-25 LAB — ROCKY MTN SPOTTED FVR AB, IGM-BLOOD: RMSF IGM: 0.22 {index} (ref 0.00–0.89)

## 2017-03-17 DIAGNOSIS — Z1211 Encounter for screening for malignant neoplasm of colon: Secondary | ICD-10-CM | POA: Diagnosis not present

## 2017-04-06 ENCOUNTER — Ambulatory Visit: Payer: BLUE CROSS/BLUE SHIELD | Admitting: Neurology

## 2017-04-07 ENCOUNTER — Telehealth: Payer: Self-pay

## 2017-04-07 NOTE — Telephone Encounter (Signed)
-----   Message from Verner Choleborah S Leonard, CNM sent at 04/06/2017  3:51 PM EDT ----- Regarding: dizziness she was having at aex, please call patient Has this resolved, she [facility]. Thanks Debbi

## 2017-04-07 NOTE — Telephone Encounter (Signed)
Called pt to see if dizziness had at her aex had resolved. [email or web address]

## 2017-04-09 NOTE — Telephone Encounter (Signed)
Left message for callback to see if her dizziness she had at her aex had resolved.

## 2017-04-10 NOTE — Telephone Encounter (Signed)
No callback from patient. Okay to close enounter?

## 2017-04-10 NOTE — Telephone Encounter (Signed)
Ok to close encounter, she is aware to call

## 2017-04-30 ENCOUNTER — Other Ambulatory Visit: Payer: Self-pay | Admitting: Obstetrics and Gynecology

## 2017-04-30 DIAGNOSIS — Z1231 Encounter for screening mammogram for malignant neoplasm of breast: Secondary | ICD-10-CM

## 2017-05-18 DIAGNOSIS — H18823 Corneal disorder due to contact lens, bilateral: Secondary | ICD-10-CM | POA: Diagnosis not present

## 2017-05-19 ENCOUNTER — Ambulatory Visit: Payer: BLUE CROSS/BLUE SHIELD

## 2017-05-28 ENCOUNTER — Ambulatory Visit
Admission: RE | Admit: 2017-05-28 | Discharge: 2017-05-28 | Disposition: A | Discharge status: Home / Self Care | Payer: BLUE CROSS/BLUE SHIELD | Source: Ambulatory Visit | Attending: Family Medicine | Admitting: Family Medicine

## 2017-05-28 DIAGNOSIS — Z1231 Encounter for screening mammogram for malignant neoplasm of breast: Secondary | ICD-10-CM

## 2017-08-09 ENCOUNTER — Telehealth: Payer: Self-pay | Admitting: Gastroenterology

## 2017-08-09 MED ORDER — ONDANSETRON HCL 4 MG PO TABS: 4.0000 mg | ORAL_TABLET | Freq: Three times a day (TID) | ORAL | 0 refills | Status: DC | PRN

## 2017-08-09 NOTE — Addendum Note (Signed)
Addended by: Napoleon FormNANDIGAM, KAVITHA V on: 08/09/2017 06:35 PM   Modules accepted: Orders

## 2017-08-09 NOTE — Telephone Encounter (Signed)
Oncall Note: Dr Kenna GilbertMann's patient called complaining that she is feeling weak and has nausea, is anxious about drinking bowel prep and colonoscopy tomorrow AM. Reassured patient and advised her to do the bowel prep as instructed. Sent Rx for Zofran 4mg  q8h prn for nausea.      Iona BeardK. Veena Sarabi Sockwell , MD [phone] Mon-Fri 8a-5p [phone] after 5p, weekends, holidays

## 2017-08-10 DIAGNOSIS — D120 Benign neoplasm of cecum: Secondary | ICD-10-CM | POA: Diagnosis not present

## 2017-08-10 DIAGNOSIS — D121 Benign neoplasm of appendix: Secondary | ICD-10-CM | POA: Diagnosis not present

## 2017-08-10 DIAGNOSIS — K635 Polyp of colon: Secondary | ICD-10-CM | POA: Diagnosis not present

## 2017-08-10 DIAGNOSIS — Z1211 Encounter for screening for malignant neoplasm of colon: Secondary | ICD-10-CM | POA: Diagnosis not present

## 2017-08-26 DIAGNOSIS — D1801 Hemangioma of skin and subcutaneous tissue: Secondary | ICD-10-CM | POA: Diagnosis not present

## 2017-08-26 DIAGNOSIS — L570 Actinic keratosis: Secondary | ICD-10-CM | POA: Diagnosis not present

## 2018-02-16 ENCOUNTER — Other Ambulatory Visit: Payer: Self-pay

## 2018-02-16 ENCOUNTER — Encounter: Payer: Self-pay | Admitting: Certified Nurse Midwife

## 2018-02-16 ENCOUNTER — Ambulatory Visit (INDEPENDENT_AMBULATORY_CARE_PROVIDER_SITE_OTHER): Payer: BLUE CROSS/BLUE SHIELD | Admitting: Certified Nurse Midwife

## 2018-02-16 VITALS — BP 100/64 | HR 64 | Resp 16 | Ht 66.5 in | Wt 159.0 lb

## 2018-02-16 DIAGNOSIS — N951 Menopausal and female climacteric states: Secondary | ICD-10-CM

## 2018-02-16 DIAGNOSIS — Z01419 Encounter for gynecological examination (general) (routine) without abnormal findings: Secondary | ICD-10-CM

## 2018-02-16 DIAGNOSIS — N926 Irregular menstruation, unspecified: Secondary | ICD-10-CM | POA: Diagnosis not present

## 2018-02-16 NOTE — Patient Instructions (Signed)

## 2018-02-16 NOTE — Progress Notes (Signed)
52 y.o. [id number] Married  Caucasian Fe here for annual exam. Periods normal except this LMP which was 2 weeks late, with increase in bleeding this time only. Some night sweats, but no hot flashes. Has not had any more memory changes. Decided not to pursue neurology at this time. Had colonoscopy with polyps noted, needs repeat in 3 years. Busy as family is growing up. Sees Urgent care if needed. No health issues today. Planning great vacation in [place]!  LMP 02/13/18           Sexually active: Yes.    The current method of family planning is rhythm method & condoms.    Exercising: Yes.    walking Smoker:  no  Health Maintenance: Pap:  02-07-15 neg HPV HR neg, 02-13-17 neg History of Abnormal Pap: no MMG:  05-28-17 category d density birads 2:neg Self Breast exams: yes Colonoscopy: 2018 f/u 3 yrs, BMD:   none TDaP:  2018 Shingles: no Pneumonia: no Hep C and HIV: HIV tested with pregnancy, hep c neg 2017 Labs:declines today, all normal in 2018   reports that she has never smoked. She has never used smokeless tobacco. She reports that she drinks about 0.6 - 1.2 oz of alcohol per week. She reports that she does not use drugs.  Past Medical History:  Diagnosis Date  . Anemia    history  . Tuberculosis 1999   Test pos for TB,  neg chest x-ray per patient    Past Surgical History:  Procedure Laterality Date  . BREAST SURGERY     local - cyst removed from breast  . DILATION AND CURETTAGE OF UTERUS    . hysteroscopic resection  01/19/12   of polyp, D&C- benign  . svd     x 3  . Tongue Clipped     at age 4 yrs old  . WISDOM TOOTH EXTRACTION      Current Outpatient Medications  Medication Sig Dispense Refill  . Calcium Carbonate-Vitamin D (CALCIUM + D PO) Take by mouth as needed.     . Cyanocobalamin (B-12 PO) Take by mouth daily.    Marland Kitchen ibuprofen (ADVIL,MOTRIN) 200 MG tablet Take 400 mg by mouth daily as needed. headache    . ondansetron (ZOFRAN) 4 MG tablet Take 1 tablet (4 mg total)  every 8 (eight) hours as needed by mouth for nausea or vomiting. 4 tablet 0   No current facility-administered medications for this visit.     Family History  Problem Relation Age of Onset  . Thyroid disease Mother        hypothyroid  . Cancer Mother        lung  . Hypertension Father   . Heart disease Father        pacemaker  . Dementia Maternal Grandmother   . Emphysema Maternal Grandfather   . Breast cancer Paternal Grandmother     ROS:  Pertinent items are noted in HPI.  Otherwise, a comprehensive ROS was negative.  Exam:   There were no vitals taken for this visit.   Ht Readings from Last 3 Encounters:  02/23/17  (1.727 m)  02/13/17 5' 6.5" (1.689 m)  02/13/16 5' 6.5" (1.689 m)    General appearance: alert, cooperative and appears stated age Head: Normocephalic, without obvious abnormality, atraumatic Neck: no adenopathy, supple, symmetrical, trachea midline and thyroid normal to inspection and palpation Lungs: clear to auscultation bilaterally Breasts: normal appearance, no masses or tenderness, No nipple retraction or dimpling, No nipple  discharge or bleeding, No axillary or supraclavicular adenopathy Heart: regular rate and rhythm Abdomen: soft, non-tender; no masses,  no organomegaly Extremities: extremities normal, atraumatic, no cyanosis or edema Skin: Skin color, texture, turgor normal. No rashes or lesions Lymph nodes: Cervical, supraclavicular, and axillary nodes normal. No abnormal inguinal nodes palpated Neurologic: Grossly normal   Pelvic: External genitalia:  no lesions,normal female              Urethra:  normal appearing urethra with no masses, tenderness or lesions              Bartholin's and Skene's: normal                 Vagina: normal appearing vagina with normal color and discharge, no lesions, blood present with period              Cervix: multiparous appearance, no cervical motion tenderness and no lesions              Pap taken:  No. Bimanual Exam:  Uterus:  normal size, contour, position, consistency, mobility, non-tender              Adnexa: normal adnexa and no mass, fullness, tenderness               Rectovaginal: Confirms               Anus:  normal sphincter tone, no lesions  Chaperone present: yes  A:  Well Woman with normal exam  Contraception  Condoms/NFP  Perimenopausal with menstrual cycle changes and occasional night sweats  History of colon polyps on first colonoscopy  Family history of dementia with mother(deceased)  P:   Reviewed health and wellness pertinent to exam  Discussed perimenopause/menopause and etiology and expectations with bleeding with cycle. Discussed need to advise if no period in 3 months or bleeding more frequently in regular cycle. Printed information given. Questions addressed.  Patient will advise if she would like referral to neurology if memory concerns.   Pap smear: no   counseled on breast self exam, mammography screening, feminine hygiene, menopause, adequate intake of calcium and vitamin D, diet and exercise  return annually or prn  An After Visit Summary was printed and given to the patient.

## 2018-05-20 ENCOUNTER — Other Ambulatory Visit: Payer: Self-pay | Admitting: Obstetrics and Gynecology

## 2018-05-20 DIAGNOSIS — Z1231 Encounter for screening mammogram for malignant neoplasm of breast: Secondary | ICD-10-CM

## 2018-06-17 ENCOUNTER — Ambulatory Visit
Admission: RE | Admit: 2018-06-17 | Discharge: 2018-06-17 | Disposition: A | Discharge status: Home / Self Care | Payer: BLUE CROSS/BLUE SHIELD | Source: Ambulatory Visit | Attending: Family Medicine | Admitting: Family Medicine

## 2018-06-17 ENCOUNTER — Other Ambulatory Visit: Payer: Self-pay | Admitting: Certified Nurse Midwife

## 2018-06-17 DIAGNOSIS — Z1231 Encounter for screening mammogram for malignant neoplasm of breast: Secondary | ICD-10-CM

## 2018-09-01 ENCOUNTER — Telehealth: Payer: Self-pay | Admitting: *Deleted

## 2018-09-01 ENCOUNTER — Other Ambulatory Visit: Payer: Self-pay | Admitting: *Deleted

## 2018-09-01 DIAGNOSIS — Z1231 Encounter for screening mammogram for malignant neoplasm of breast: Secondary | ICD-10-CM

## 2018-09-01 NOTE — Telephone Encounter (Signed)
Call to patient per [name], CNM request. Cynthia CrosbyAdvised Tyrer-Cuzick lifetime risk assessment 32.1%, yearly screening MRI recommended. Patient declined at this time, would like to think about it more, will return call to office if she would like to proceed. Questions answered.   Copy of Tyrer-Cuzixk to scan.  Routing to provider for final review. Patient is agreeable to disposition. Will close encounter.

## 2018-09-01 NOTE — Progress Notes (Signed)
Opened in error. Encounter closed.

## 2018-10-14 DIAGNOSIS — L570 Actinic keratosis: Secondary | ICD-10-CM | POA: Diagnosis not present

## 2019-02-18 ENCOUNTER — Ambulatory Visit: Payer: BLUE CROSS/BLUE SHIELD | Admitting: Certified Nurse Midwife

## 2019-04-20 ENCOUNTER — Ambulatory Visit: Payer: BLUE CROSS/BLUE SHIELD | Admitting: Certified Nurse Midwife

## 2019-04-22 ENCOUNTER — Other Ambulatory Visit (HOSPITAL_COMMUNITY)
Admission: RE | Admit: 2019-04-22 | Discharge: 2019-04-22 | Disposition: A | Discharge status: Home / Self Care | Payer: BC Managed Care – PPO | Source: Ambulatory Visit | Attending: Certified Nurse Midwife | Admitting: Certified Nurse Midwife

## 2019-04-22 ENCOUNTER — Ambulatory Visit (INDEPENDENT_AMBULATORY_CARE_PROVIDER_SITE_OTHER): Payer: BC Managed Care – PPO | Admitting: Certified Nurse Midwife

## 2019-04-22 ENCOUNTER — Encounter: Payer: Self-pay | Admitting: Certified Nurse Midwife

## 2019-04-22 ENCOUNTER — Other Ambulatory Visit: Payer: Self-pay

## 2019-04-22 VITALS — BP 110/70 | HR 68 | Temp 97.1°F | Resp 16 | Ht 66.5 in | Wt 153.0 lb

## 2019-04-22 DIAGNOSIS — N951 Menopausal and female climacteric states: Secondary | ICD-10-CM

## 2019-04-22 DIAGNOSIS — Z01419 Encounter for gynecological examination (general) (routine) without abnormal findings: Secondary | ICD-10-CM

## 2019-04-22 DIAGNOSIS — Z124 Encounter for screening for malignant neoplasm of cervix: Secondary | ICD-10-CM | POA: Diagnosis not present

## 2019-04-22 DIAGNOSIS — E559 Vitamin D deficiency, unspecified: Secondary | ICD-10-CM | POA: Diagnosis not present

## 2019-04-22 DIAGNOSIS — N912 Amenorrhea, unspecified: Secondary | ICD-10-CM

## 2019-04-22 DIAGNOSIS — Z0000 Encounter for general adult medical examination without abnormal findings: Secondary | ICD-10-CM | POA: Diagnosis not present

## 2019-04-22 LAB — POCT URINE PREGNANCY: Preg Test, Ur: NEGATIVE

## 2019-04-22 NOTE — Progress Notes (Signed)
53 y.o. [id number] Married  Caucasian Fe here for annual exam. Contraception condoms working well.  Having some hot flashes occasional and night sweats. Missed periods for 3 months and now had one normal one. ? Menopausal changes. Denies vaginal dryness or other health issues today. Busy with family!  No LMP recorded (exact date).          Sexually active: Yes.    The current method of family planning is condoms & rhythm method.    Exercising: Yes.    walking & tennis Smoker:  no  Review of Systems  Constitutional: Negative.   HENT:       Irregular cycle  Eyes: Negative.   Respiratory: Negative.   Cardiovascular: Negative.   Gastrointestinal: Negative.   Genitourinary: Negative.   Musculoskeletal: Negative.   Skin: Negative.   Neurological: Negative.   Endo/Heme/Allergies: Negative.   Psychiatric/Behavioral: Negative.     Health Maintenance: Pap:  02-13-17 neg History of Abnormal Pap: no MMG:  06-17-18 category d density birads 1:neg Self Breast exams: occ Colonoscopy:  08-10-17 BMD:   none TDaP:  2018 Shingles: no Pneumonia: no Hep C and HIV: HIV neg with pregnancy, Hep c neg 2017 Labs: upt-neg   reports that she has never smoked. She has never used smokeless tobacco. She reports current alcohol use of about 1.0 standard drinks of alcohol per week. She reports that she does not use drugs.  Past Medical History:  Diagnosis Date  . Anemia    history  . Tuberculosis 1999   Test pos for TB,  neg chest x-ray per patient    Past Surgical History:  Procedure Laterality Date  . BREAST SURGERY     local - cyst removed from breast  . DILATION AND CURETTAGE OF UTERUS    . hysteroscopic resection  01/19/12   of polyp, D&C- benign  . svd     x 3  . Tongue Clipped     at age 89 yrs old  . WISDOM TOOTH EXTRACTION      Current Outpatient Medications  Medication Sig Dispense Refill  . ibuprofen (ADVIL,MOTRIN) 200 MG tablet Take 400 mg by mouth daily as needed. headache      No current facility-administered medications for this visit.     Family History  Problem Relation Age of Onset  . Thyroid disease Mother        hypothyroid  . Cancer Mother        lung  . Hypertension Father   . Heart disease Father        pacemaker  . Dementia Maternal Grandmother   . Emphysema Maternal Grandfather   . Breast cancer Paternal Grandmother     ROS:  Pertinent items are noted in HPI.  Otherwise, a comprehensive ROS was negative.  Exam:   BP 110/70   Pulse 68   Temp (!) 97.1 F (36.2 C) (Skin)   Resp 16   Ht 5' 6.5" (1.689 m)   Wt 153 lb (69.4 kg)   LMP  (Exact Date) Comment: june  BMI 24.32 kg/m  Height: 5' 6.5" (168.9 cm) Ht Readings from Last 3 Encounters:  04/22/19 5' 6.5" (1.689 m)  02/16/18 5' 6.5" (1.689 m)  02/23/17 5\' 8"  (1.727 m)    General appearance: alert, cooperative and appears stated age Head: Normocephalic, without obvious abnormality, atraumatic Neck: no adenopathy, supple, symmetrical, trachea midline and thyroid normal to inspection and palpation Lungs: clear to auscultation bilaterally Breasts: normal appearance, no masses or tenderness,  No nipple retraction or dimpling, No nipple discharge or bleeding, No axillary or supraclavicular adenopathy Heart: regular rate and rhythm Abdomen: soft, non-tender; no masses,  no organomegaly Extremities: extremities normal, atraumatic, no cyanosis or edema Skin: Skin color, texture, turgor normal. No rashes or lesions Lymph nodes: Cervical, supraclavicular, and axillary nodes normal. No abnormal inguinal nodes palpated Neurologic: Grossly normal   Pelvic: External genitalia:  no lesions              Urethra:  normal appearing urethra with no masses, tenderness or lesions              Bartholin's and Skene's: normal                 Vagina: normal appearing vagina with normal color and discharge, no lesions              Cervix: multiparous appearance, no bleeding following Pap, no cervical  motion tenderness and no lesions              Pap taken: Yes.   Bimanual Exam:  Uterus:  normal size, contour, position, consistency, mobility, non-tender and anteverted              Adnexa: normal adnexa and no mass, fullness, tenderness               Rectovaginal: Confirms               Anus:  normal sphincter tone, no lesions  Chaperone present: yes  A:  Well Woman with normal exam  Contraception condoms at times   Menses change ? Perimenopausal with amenorrhea negative UPT today.  P:   Reviewed health and wellness pertinent to exam  Discussed menopause/perimenopausal changes with menstrual cycle and expectations. Also discussed Thyroid and pituitary changes that can affect cycle. Needs to advise if misses 3 months again to avoid hyperplasia concerns and heavy bleeding possibility. Recommend labs today. Agreeable.  Labs:FSH, TSH, Prolactin, Lipid panel, CBC, CMP, Vitamin D  Pap smear: no   counseled on breast self exam, mammography screening, feminine hygiene, menopause, adequate intake of calcium and vitamin D, diet and exercise  return annually or prn  An After Visit Summary was printed and given to the patient.

## 2019-04-23 LAB — COMPREHENSIVE METABOLIC PANEL
ALT: 12 IU/L (ref 0–32)
AST: 19 IU/L (ref 0–40)
Albumin/Globulin Ratio: 1.5 (ref 1.2–2.2)
Albumin: 4.3 g/dL (ref 3.8–4.9)
Alkaline Phosphatase: 47 IU/L (ref 39–117)
BUN/Creatinine Ratio: 16 (ref 9–23)
BUN: 11 mg/dL (ref 6–24)
Bilirubin Total: 0.3 mg/dL (ref 0.0–1.2)
CO2: 23 mmol/L (ref 20–29)
Calcium: 9.3 mg/dL (ref 8.7–10.2)
Chloride: 103 mmol/L (ref 96–106)
Creatinine, Ser: 0.67 mg/dL (ref 0.57–1.00)
GFR calc Af Amer: 117 mL/min/{1.73_m2} (ref >59)
GFR calc non Af Amer: 101 mL/min/{1.73_m2} (ref >59)
Globulin, Total: 2.8 g/dL (ref 1.5–4.5)
Glucose: 80 mg/dL (ref 65–99)
Potassium: 4.5 mmol/L (ref 3.5–5.2)
Sodium: 139 mmol/L (ref 134–144)
Total Protein: 7.1 g/dL (ref 6.0–8.5)

## 2019-04-23 LAB — PROLACTIN: Prolactin: 9.6 ng/mL (ref 4.8–23.3)

## 2019-04-23 LAB — FOLLICLE STIMULATING HORMONE: FSH: 12.3 m[IU]/mL

## 2019-04-23 LAB — CBC
Hematocrit: 38.9 % (ref 34.0–46.6)
Hemoglobin: 12.9 g/dL (ref 11.1–15.9)
MCH: 28.4 pg (ref 26.6–33.0)
MCHC: 33.2 g/dL (ref 31.5–35.7)
MCV: 86 fL (ref 79–97)
Platelets: 267 10*3/uL (ref 150–450)
RBC: 4.55 x10E6/uL (ref 3.77–5.28)
RDW: 13.2 % (ref 11.7–15.4)
WBC: 4.1 10*3/uL (ref 3.4–10.8)

## 2019-04-23 LAB — VITAMIN D 25 HYDROXY (VIT D DEFICIENCY, FRACTURES): Vit D, 25-Hydroxy: 34.6 ng/mL (ref 30.0–100.0)

## 2019-04-23 LAB — TSH: TSH: 1.7 u[IU]/mL (ref 0.450–4.500)

## 2019-04-23 LAB — LIPID PANEL
Chol/HDL Ratio: 3.1 ratio (ref 0.0–4.4)
Cholesterol, Total: 152 mg/dL (ref 100–199)
HDL: 49 mg/dL (ref >39)
LDL Calculated: 76 mg/dL (ref 0–99)
Triglycerides: 136 mg/dL (ref 0–149)
VLDL Cholesterol Cal: 27 mg/dL (ref 5–40)

## 2019-04-25 ENCOUNTER — Telehealth: Payer: Self-pay

## 2019-04-25 NOTE — Telephone Encounter (Signed)
Left message for call back.

## 2019-04-25 NOTE — Telephone Encounter (Signed)
-----   Message from Regina Eck, CNM sent at 04/23/2019 11:06 AM EDT ----- Notify patient her [facility] is not showing menopause at this point. She needs to advise if she misses 3 periods will need to assess. She is experiencing cycle changes that occur at this age. TSH and Prolactin levels were normal Lipid panel is great, cholesterol at 152, triglycerides 136, HDL 49, LDL is 76 Liver, kidney and glucose profile normal Vitamin D is borderline normal at 34.6  Work on food sources of Vitamin D. CBC is norma, no anemia

## 2019-04-25 NOTE — Telephone Encounter (Signed)
Patient notified of results. See lab 

## 2019-04-26 LAB — CYTOLOGY - PAP
Diagnosis: NEGATIVE
HPV: NOT DETECTED

## 2019-07-19 ENCOUNTER — Other Ambulatory Visit: Payer: Self-pay | Admitting: Certified Nurse Midwife

## 2019-07-19 ENCOUNTER — Other Ambulatory Visit: Payer: Self-pay

## 2019-07-19 ENCOUNTER — Ambulatory Visit
Admission: RE | Admit: 2019-07-19 | Discharge: 2019-07-19 | Disposition: A | Discharge status: Home / Self Care | Payer: BC Managed Care – PPO | Source: Ambulatory Visit | Attending: Certified Nurse Midwife | Admitting: Certified Nurse Midwife

## 2019-07-19 DIAGNOSIS — Z1231 Encounter for screening mammogram for malignant neoplasm of breast: Secondary | ICD-10-CM

## 2019-10-03 ENCOUNTER — Other Ambulatory Visit: Payer: Self-pay | Admitting: Cardiology

## 2019-10-03 DIAGNOSIS — Z20822 Contact with and (suspected) exposure to covid-19: Secondary | ICD-10-CM | POA: Diagnosis not present

## 2019-10-03 DIAGNOSIS — Z03818 Encounter for observation for suspected exposure to other biological agents ruled out: Secondary | ICD-10-CM | POA: Diagnosis not present

## 2019-10-05 LAB — NOVEL CORONAVIRUS, NAA: SARS-CoV-2, NAA: NOT DETECTED

## 2019-12-12 ENCOUNTER — Encounter: Payer: Self-pay | Admitting: Certified Nurse Midwife

## 2020-01-04 DIAGNOSIS — L918 Other hypertrophic disorders of the skin: Secondary | ICD-10-CM | POA: Diagnosis not present

## 2020-01-04 DIAGNOSIS — L718 Other rosacea: Secondary | ICD-10-CM | POA: Diagnosis not present

## 2020-01-04 DIAGNOSIS — D2262 Melanocytic nevi of left upper limb, including shoulder: Secondary | ICD-10-CM | POA: Diagnosis not present

## 2020-01-04 DIAGNOSIS — D2261 Melanocytic nevi of right upper limb, including shoulder: Secondary | ICD-10-CM | POA: Diagnosis not present

## 2020-01-31 IMAGING — MG DIGITAL SCREENING BILAT W/ TOMO
8 series · 9 of 24 positions shown · non-contrast
Comparison: Previous exam(s).

CLINICAL DATA: Screening.

EXAM:
DIGITAL SCREENING BILATERAL MAMMOGRAM WITH TOMO AND CAD

[R MLO synth-2D]
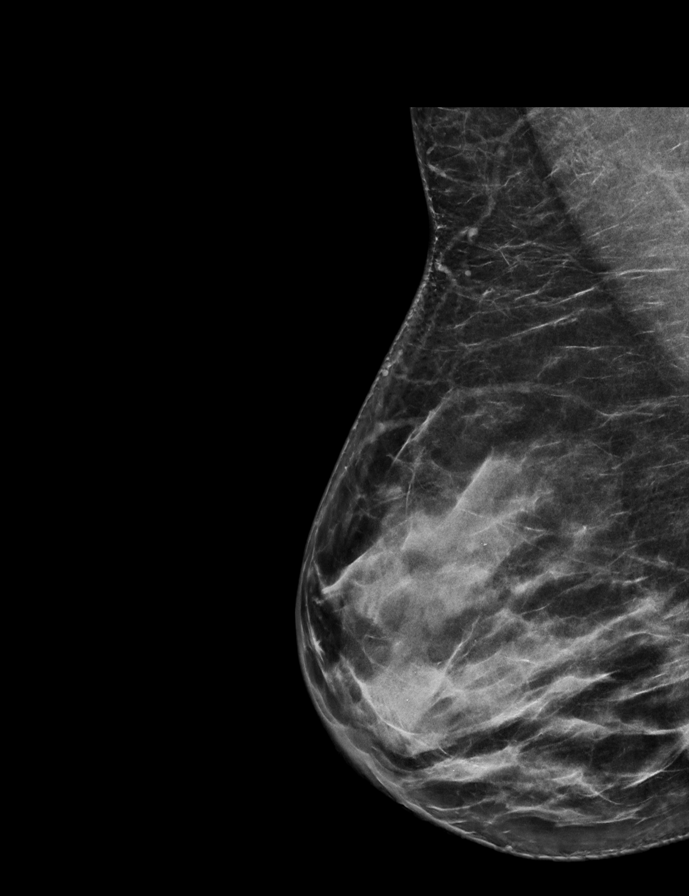

[R CC synth-2D]
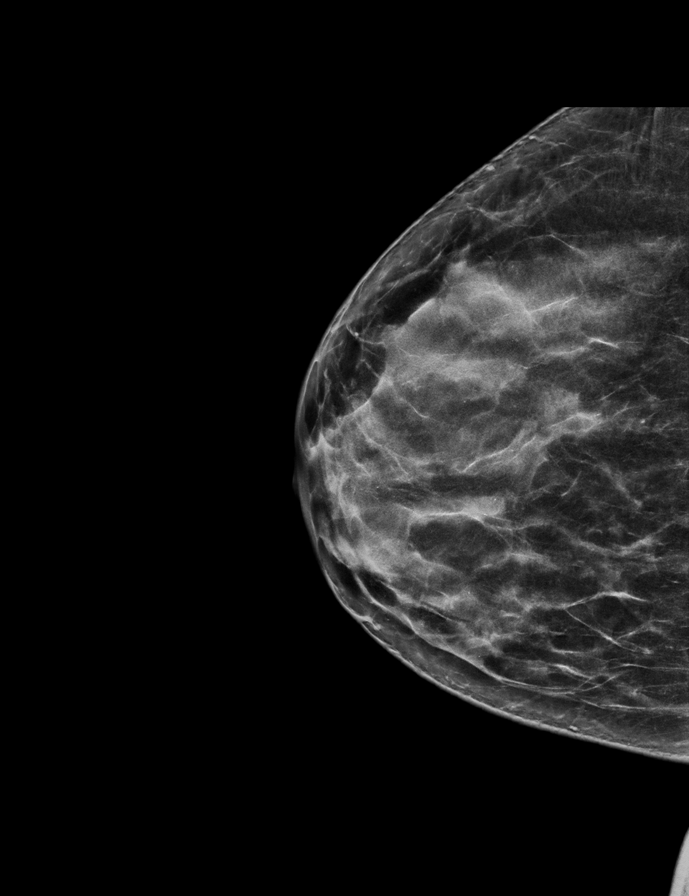

[L CC synth-2D]
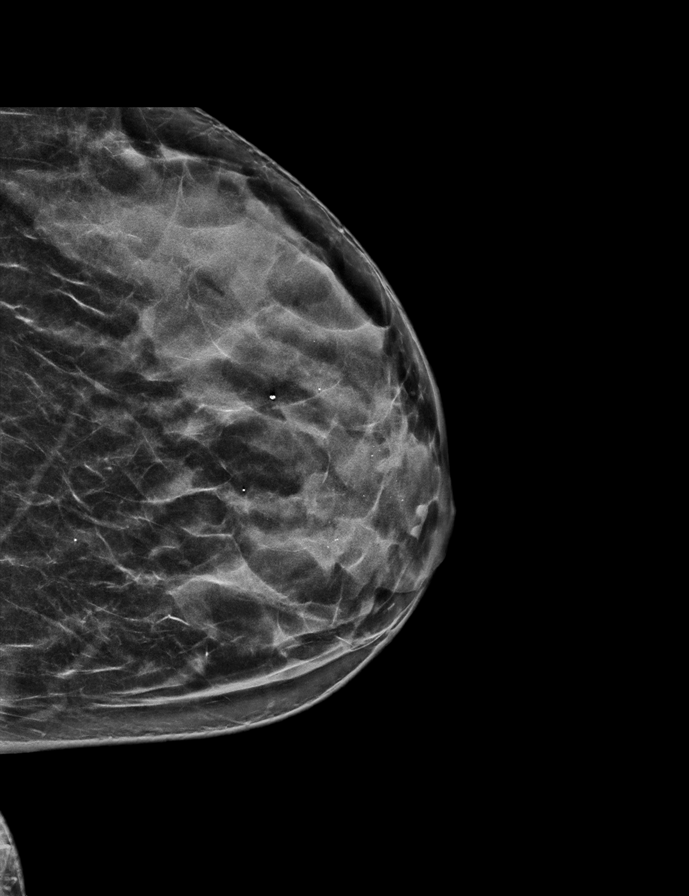

[L MLO synth-2D]
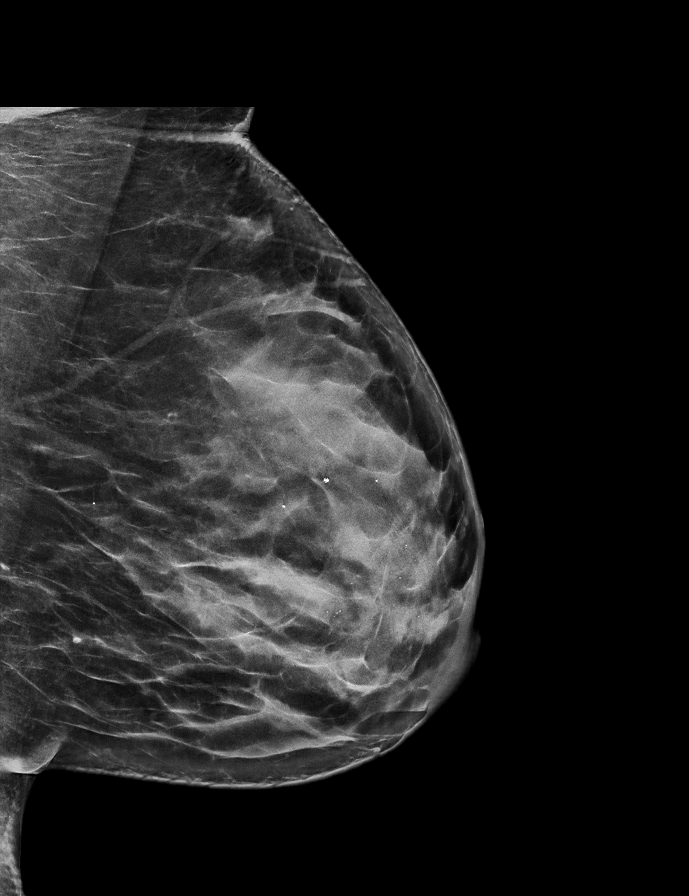

[R MLO tomo · 2 of 68 frames shown]
[frame 22/68]
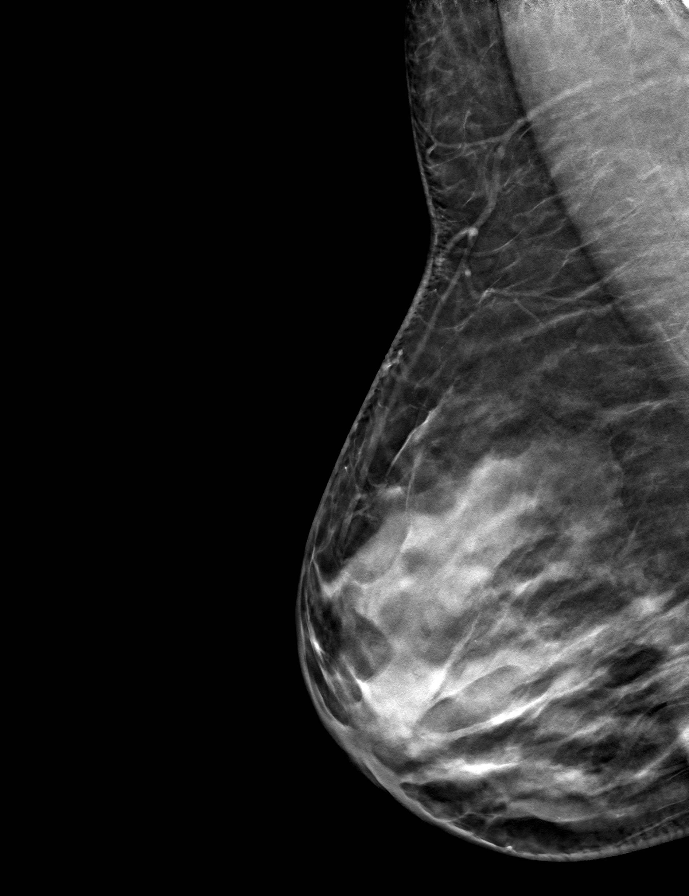
[frame 35/68]
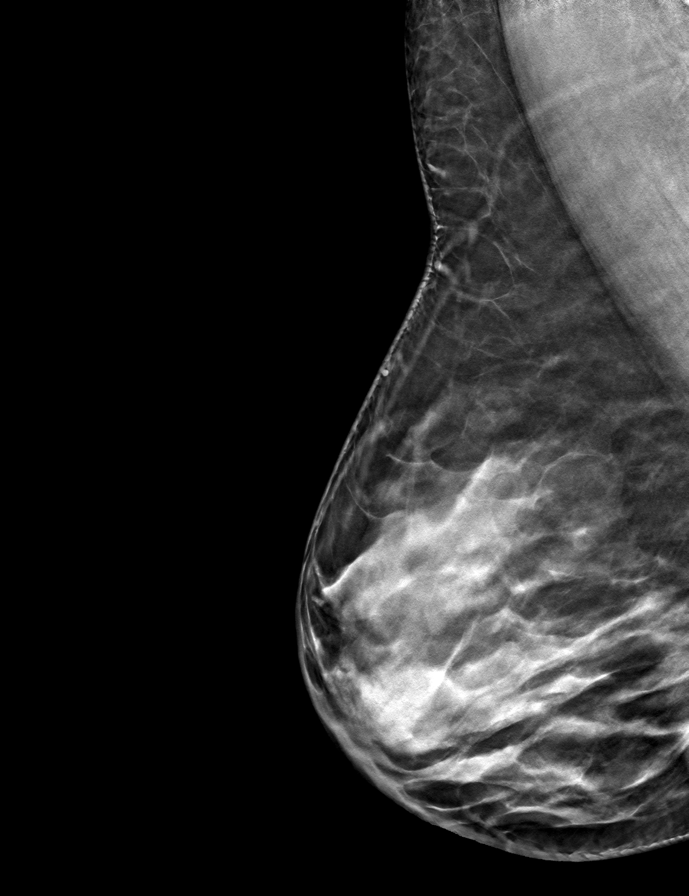

[L MLO tomo · tomo slice 35/69.0]
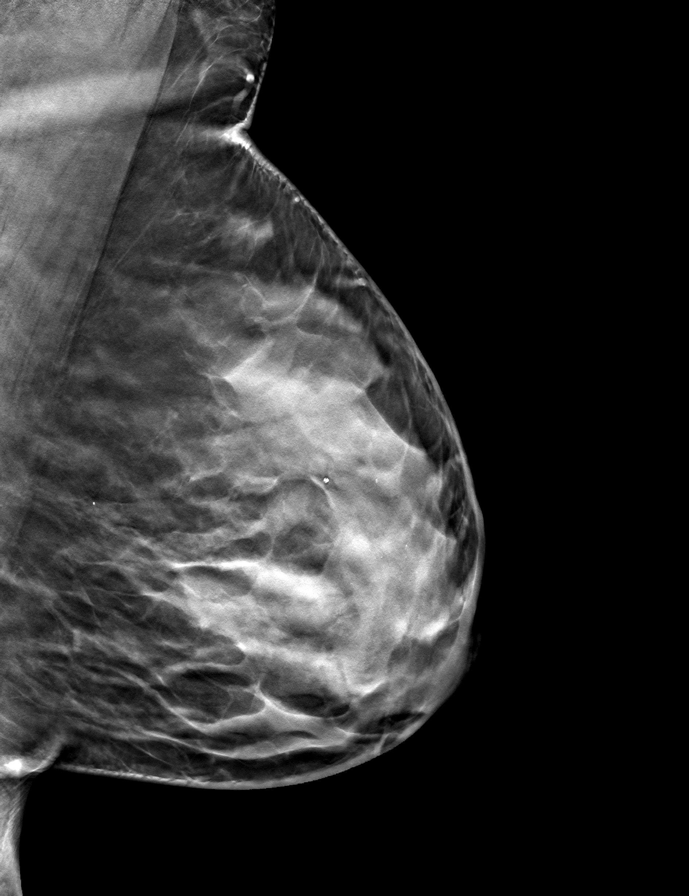

[R CC tomo · tomo slice 31/61.0]
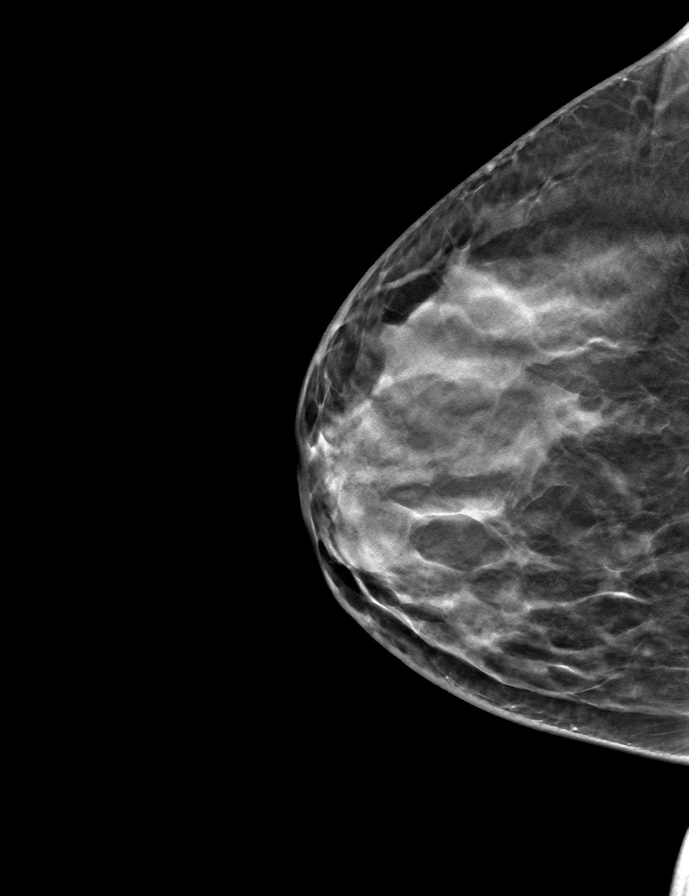

[L CC tomo · tomo slice 30/59.0]
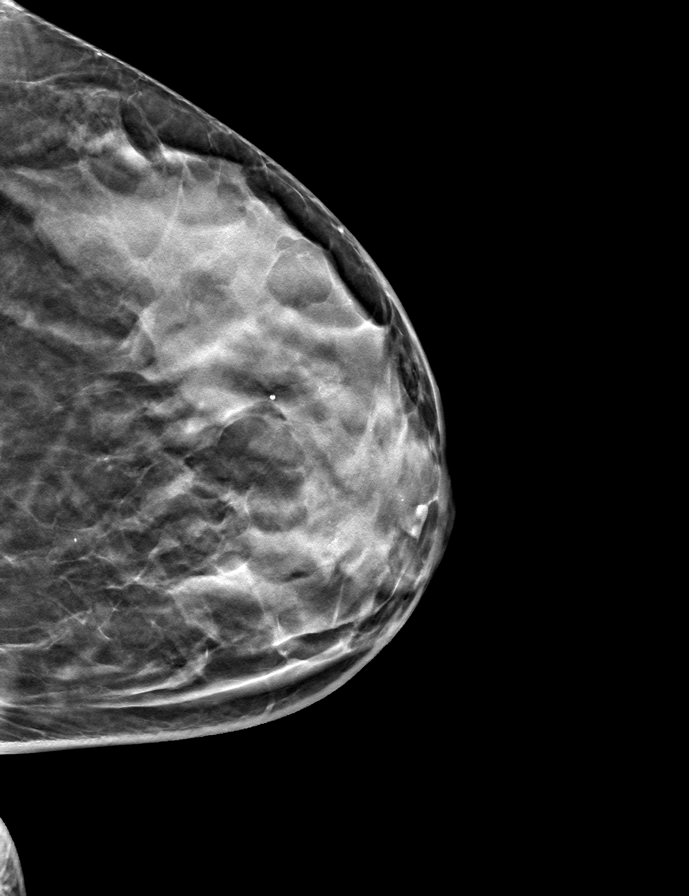

[9 of 24 positions shown; findings below may reference images not displayed]

ACR Breast Density Category d: The breast tissue is extremely dense,
which lowers the sensitivity of mammography
FINDINGS: There are no findings suspicious for malignancy. Images were
processed with CAD.
IMPRESSION: No mammographic evidence of malignancy. A result letter of this
screening mammogram will be mailed directly to the patient.

RECOMMENDATION:
Screening mammogram in one year. (Code:WO-0-ZI0)

BI-RADS CATEGORY  1: Negative.

## 2020-04-04 DIAGNOSIS — Z20822 Contact with and (suspected) exposure to covid-19: Secondary | ICD-10-CM | POA: Diagnosis not present

## 2020-06-19 ENCOUNTER — Other Ambulatory Visit: Payer: Self-pay | Admitting: Obstetrics and Gynecology

## 2020-06-19 DIAGNOSIS — Z1231 Encounter for screening mammogram for malignant neoplasm of breast: Secondary | ICD-10-CM

## 2020-07-19 ENCOUNTER — Ambulatory Visit: Payer: BC Managed Care – PPO

## 2020-09-17 ENCOUNTER — Other Ambulatory Visit: Payer: BC Managed Care – PPO

## 2020-09-17 DIAGNOSIS — Z20822 Contact with and (suspected) exposure to covid-19: Secondary | ICD-10-CM

## 2020-09-18 ENCOUNTER — Ambulatory Visit: Payer: Self-pay | Admitting: *Deleted

## 2020-09-18 NOTE — Telephone Encounter (Signed)
Patient called to request covid test results. No results available at this time. Patient verbalized understanding.

## 2020-09-19 LAB — SARS-COV-2, NAA 2 DAY TAT

## 2020-09-19 LAB — NOVEL CORONAVIRUS, NAA: SARS-CoV-2, NAA: DETECTED — AB

## 2022-08-27 DIAGNOSIS — Z801 Family history of malignant neoplasm of trachea, bronchus and lung: Secondary | ICD-10-CM | POA: Diagnosis not present

## 2022-08-27 DIAGNOSIS — Z6825 Body mass index (BMI) 25.0-25.9, adult: Secondary | ICD-10-CM | POA: Diagnosis not present

## 2022-08-27 DIAGNOSIS — Z1321 Encounter for screening for nutritional disorder: Secondary | ICD-10-CM | POA: Diagnosis not present

## 2022-08-27 DIAGNOSIS — Z01419 Encounter for gynecological examination (general) (routine) without abnormal findings: Secondary | ICD-10-CM | POA: Diagnosis not present

## 2022-08-27 DIAGNOSIS — Z13220 Encounter for screening for lipoid disorders: Secondary | ICD-10-CM | POA: Diagnosis not present

## 2022-08-27 DIAGNOSIS — Z131 Encounter for screening for diabetes mellitus: Secondary | ICD-10-CM | POA: Diagnosis not present

## 2022-08-27 DIAGNOSIS — Z1231 Encounter for screening mammogram for malignant neoplasm of breast: Secondary | ICD-10-CM | POA: Diagnosis not present

## 2022-08-27 DIAGNOSIS — Z130 Encounter for screening for diseases of the blood and blood-forming organs and certain disorders involving the immune mechanism: Secondary | ICD-10-CM | POA: Diagnosis not present

## 2022-08-27 DIAGNOSIS — Z1329 Encounter for screening for other suspected endocrine disorder: Secondary | ICD-10-CM | POA: Diagnosis not present

## 2022-08-27 DIAGNOSIS — Z803 Family history of malignant neoplasm of breast: Secondary | ICD-10-CM | POA: Diagnosis not present

## 2022-10-17 DIAGNOSIS — Z809 Family history of malignant neoplasm, unspecified: Secondary | ICD-10-CM | POA: Diagnosis not present

## 2022-10-17 DIAGNOSIS — Z9189 Other specified personal risk factors, not elsewhere classified: Secondary | ICD-10-CM | POA: Diagnosis not present

## 2022-10-22 ENCOUNTER — Other Ambulatory Visit: Payer: Self-pay | Admitting: Obstetrics and Gynecology

## 2022-10-22 DIAGNOSIS — Z1239 Encounter for other screening for malignant neoplasm of breast: Secondary | ICD-10-CM

## 2022-10-30 DIAGNOSIS — Z7189 Other specified counseling: Secondary | ICD-10-CM | POA: Diagnosis not present

## 2022-10-30 DIAGNOSIS — Z23 Encounter for immunization: Secondary | ICD-10-CM | POA: Diagnosis not present

## 2022-11-20 ENCOUNTER — Other Ambulatory Visit: Payer: BC Managed Care – PPO

## 2023-01-15 DIAGNOSIS — D2272 Melanocytic nevi of left lower limb, including hip: Secondary | ICD-10-CM | POA: Diagnosis not present

## 2023-01-15 DIAGNOSIS — D225 Melanocytic nevi of trunk: Secondary | ICD-10-CM | POA: Diagnosis not present

## 2023-01-15 DIAGNOSIS — L814 Other melanin hyperpigmentation: Secondary | ICD-10-CM | POA: Diagnosis not present

## 2023-01-15 DIAGNOSIS — D2271 Melanocytic nevi of right lower limb, including hip: Secondary | ICD-10-CM | POA: Diagnosis not present

## 2023-09-03 DIAGNOSIS — Z1151 Encounter for screening for human papillomavirus (HPV): Secondary | ICD-10-CM | POA: Diagnosis not present

## 2023-09-03 DIAGNOSIS — Z124 Encounter for screening for malignant neoplasm of cervix: Secondary | ICD-10-CM | POA: Diagnosis not present

## 2023-09-03 DIAGNOSIS — Z1231 Encounter for screening mammogram for malignant neoplasm of breast: Secondary | ICD-10-CM | POA: Diagnosis not present

## 2023-09-03 DIAGNOSIS — Z6825 Body mass index (BMI) 25.0-25.9, adult: Secondary | ICD-10-CM | POA: Diagnosis not present

## 2023-09-03 DIAGNOSIS — Z01419 Encounter for gynecological examination (general) (routine) without abnormal findings: Secondary | ICD-10-CM | POA: Diagnosis not present

## 2023-09-09 DIAGNOSIS — Z1321 Encounter for screening for nutritional disorder: Secondary | ICD-10-CM | POA: Diagnosis not present

## 2023-09-09 DIAGNOSIS — R739 Hyperglycemia, unspecified: Secondary | ICD-10-CM | POA: Diagnosis not present

## 2023-09-09 DIAGNOSIS — Z13220 Encounter for screening for lipoid disorders: Secondary | ICD-10-CM | POA: Diagnosis not present

## 2023-09-09 DIAGNOSIS — Z13228 Encounter for screening for other metabolic disorders: Secondary | ICD-10-CM | POA: Diagnosis not present

## 2023-09-09 DIAGNOSIS — Z130 Encounter for screening for diseases of the blood and blood-forming organs and certain disorders involving the immune mechanism: Secondary | ICD-10-CM | POA: Diagnosis not present

## 2023-09-28 ENCOUNTER — Other Ambulatory Visit: Payer: BLUE CROSS/BLUE SHIELD

## 2024-01-19 DIAGNOSIS — C4402 Squamous cell carcinoma of skin of lip: Secondary | ICD-10-CM | POA: Diagnosis not present

## 2024-01-19 DIAGNOSIS — L570 Actinic keratosis: Secondary | ICD-10-CM | POA: Diagnosis not present

## 2024-02-22 DIAGNOSIS — C4402 Squamous cell carcinoma of skin of lip: Secondary | ICD-10-CM | POA: Diagnosis not present

## 2024-03-29 DIAGNOSIS — N951 Menopausal and female climacteric states: Secondary | ICD-10-CM | POA: Diagnosis not present

## 2024-04-27 ENCOUNTER — Encounter (INDEPENDENT_AMBULATORY_CARE_PROVIDER_SITE_OTHER): Payer: Self-pay | Admitting: Physician Assistant

## 2024-04-27 ENCOUNTER — Ambulatory Visit (INDEPENDENT_AMBULATORY_CARE_PROVIDER_SITE_OTHER): Admitting: Physician Assistant

## 2024-04-27 VITALS — BP 100/66 | HR 75

## 2024-04-27 DIAGNOSIS — H6121 Impacted cerumen, right ear: Secondary | ICD-10-CM | POA: Diagnosis not present

## 2024-04-27 NOTE — Progress Notes (Signed)
 Dear Dr. Latisha, Here is my assessment for our mutual patient, Cynthia Davis. Thank you for allowing me the opportunity to care for your patient. Please do not hesitate to contact me should you have any other questions. Sincerely, Chyrl Cohen PA-C  Otolaryngology Clinic Note Referring provider: Dr. Latisha HPI:  Cynthia Davis is a 58 y.o. female kindly referred by Dr. Latisha   The patient is a 58 year old female seen in our office for evaluation of right ear fullness.  She notes that on July 19 her right ear felt plugged up.  She notes last week it felt slightly better.  She denies any significant past medical history of the same.  No trauma to the ear, no infectious signs or symptoms.  She notes her baseline hearing was good before this.      Independent Review of Additional Tests or Records:  None   PMH/Meds/All/SocHx/FamHx/ROS:   Past Medical History:  Diagnosis Date   Anemia    history   Tuberculosis 1999   Test pos for TB,  neg chest x-ray per patient     Past Surgical History:  Procedure Laterality Date   BREAST SURGERY     local - cyst removed from breast   DILATION AND CURETTAGE OF UTERUS     hysteroscopic resection  01/19/12   of polyp, D&C- benign   svd     x 3   Tongue Clipped     at age 18 yrs old   WISDOM TOOTH EXTRACTION      Family History  Problem Relation Age of Onset   Thyroid  disease Mother        hypothyroid   Cancer Mother        lung   Hypertension Father    Heart disease Father        pacemaker   Dementia Maternal Grandmother    Emphysema Maternal Grandfather    Breast cancer Paternal Grandmother      Social Connections: Not on file      Current Outpatient Medications:    ibuprofen (ADVIL,MOTRIN) 200 MG tablet, Take 400 mg by mouth daily as needed. headache, Disp: , Rfl:    Physical Exam:   BP 100/66   Pulse 75   SpO2 96%   Pertinent Findings  CN II-XII intact Right external auditory canal with cerumen impaction,  left EAC clear with intact well-pneumatized middle ear space ear spaces Weber 512: equal Rinne 512: AC > BC b/l  No lesions of oral cavity/oropharynx; dentition within normal limits No obviously palpable neck masses/lymphadenopathy/thyromegaly No respiratory distress or stridor  Seprately Identifiable Procedures:  Procedure: bilateral ear microscopy and cerumen removal using microscope (CPT [id number]) - Mod 25 Pre-procedure diagnosis: unilateral cerumen impaction right external auditory canal Post-procedure diagnosis: same Indication: bilateral cerumen impaction; given patient's otologic complaints and history as well as for improved and comprehensive examination of external ear and tympanic membrane, bilateral otologic examination using microscope was performed and impacted cerumen removed  Procedure: Patient was placed semi-recumbent. Both ear canals were examined using the microscope with findings above. Cerumen removed from the right external auditory canal using suction and currette with improvement in EAC examination and patency. Left: EAC was patent. TM was intact . Middle ear was aerated. Drainage: none Right: EAC was patent. TM was intact . Middle ear was aerated . Drainage: none Patient tolerated the procedure well.   Impression & Plans:  Cynthia Davis is a 58 y.o. female with the following   Cerumen impaction-  Removed  without difficulty.  She had a small remainder along the anterior TM that I was unable to removed.  I recommend Debrox for the next 3 days.  I like her to follow-up in the office if she has any significant concerns or complaints at that time.  She felt like her hearing was much improved after cerumen removal today.   - f/u PRN   Thank you for allowing me the opportunity to care for your patient. Please do not hesitate to contact me should you have any other questions.  Sincerely, Chyrl Cohen PA-C [facility] Phone: [phone] Fax:  [phone]  04/27/2024, 2:47 PM

## 2024-07-28 DIAGNOSIS — N951 Menopausal and female climacteric states: Secondary | ICD-10-CM | POA: Diagnosis not present

## 2024-09-14 ENCOUNTER — Other Ambulatory Visit (HOSPITAL_COMMUNITY): Payer: Self-pay

## 2024-09-21 DIAGNOSIS — Z1231 Encounter for screening mammogram for malignant neoplasm of breast: Secondary | ICD-10-CM | POA: Diagnosis not present

## 2024-09-26 ENCOUNTER — Other Ambulatory Visit: Payer: Self-pay | Admitting: Obstetrics and Gynecology

## 2024-09-26 DIAGNOSIS — R928 Other abnormal and inconclusive findings on diagnostic imaging of breast: Secondary | ICD-10-CM

## 2024-10-07 ENCOUNTER — Ambulatory Visit: Admission: RE | Admit: 2024-10-07 | Source: Ambulatory Visit

## 2024-10-07 DIAGNOSIS — R928 Other abnormal and inconclusive findings on diagnostic imaging of breast: Secondary | ICD-10-CM
